# Patient Record
Sex: Female | Born: 1960 | Race: Black or African American | Hispanic: No | Marital: Single
Health system: Midwestern US, Community
[De-identification: ages and names within clinical notes are randomized; demographics above are authoritative.]

## PROBLEM LIST (undated history)

## (undated) DIAGNOSIS — R519 Headache, unspecified: Secondary | ICD-10-CM

## (undated) DIAGNOSIS — M1990 Unspecified osteoarthritis, unspecified site: Secondary | ICD-10-CM

## (undated) DIAGNOSIS — F32A Depression, unspecified: Secondary | ICD-10-CM

## (undated) DIAGNOSIS — Z87442 Personal history of urinary calculi: Secondary | ICD-10-CM

## (undated) DIAGNOSIS — F329 Major depressive disorder, single episode, unspecified: Secondary | ICD-10-CM

## (undated) DIAGNOSIS — F419 Anxiety disorder, unspecified: Secondary | ICD-10-CM

## (undated) DIAGNOSIS — R51 Headache: Secondary | ICD-10-CM

## (undated) DIAGNOSIS — D571 Sickle-cell disease without crisis: Secondary | ICD-10-CM

## (undated) DIAGNOSIS — T7840XA Allergy, unspecified, initial encounter: Secondary | ICD-10-CM

## (undated) DIAGNOSIS — F319 Bipolar disorder, unspecified: Secondary | ICD-10-CM

## (undated) DIAGNOSIS — I10 Essential (primary) hypertension: Secondary | ICD-10-CM

## (undated) DIAGNOSIS — R0600 Dyspnea, unspecified: Secondary | ICD-10-CM

## (undated) DIAGNOSIS — K219 Gastro-esophageal reflux disease without esophagitis: Secondary | ICD-10-CM

## (undated) DIAGNOSIS — R7303 Prediabetes: Secondary | ICD-10-CM

## (undated) DIAGNOSIS — Z1231 Encounter for screening mammogram for malignant neoplasm of breast: Secondary | ICD-10-CM

## (undated) DIAGNOSIS — Z1211 Encounter for screening for malignant neoplasm of colon: Secondary | ICD-10-CM

## (undated) HISTORY — DX: Sickle-cell disease without crisis: D57.1

## (undated) HISTORY — DX: Allergy, unspecified, initial encounter: T78.40XA

## (undated) HISTORY — DX: Anxiety disorder, unspecified: F41.9

## (undated) HISTORY — DX: Unspecified osteoarthritis, unspecified site: M19.90

## (undated) HISTORY — PX: ABDOMINAL HYSTERECTOMY: SHX81

---

## 2008-06-13 NOTE — ED Notes (Signed)
Patient reports pain in left knee x 6 months. Patient reports pain worsened today. She reports pain when walking or putting "pressur on it." ROMx4 extremities. Capillary refill brisk. Pedal pulses present bilaterally.

## 2008-06-13 NOTE — ED Provider Notes (Signed)
Patient is a 48 y.o. female presenting with knee pain. The history is provided by the patient. No language interpreter was used.   Knee Injury   This is a recurrent (Has H/O Ch. Lt. knee pain of 6 months ago) problem. The current episode started more than 1 week ago. The problem occurs constantly. The problem has not changed since onset. The pain is present in the left knee. The quality of the pain is described as dull. The pain is at a severity of 7/10. Associated symptoms include limited range of motion. Pertinent negatives include no numbness, no stiffness, no tingling, no back pain and no neck pain. The symptoms are aggravated by activity and movement. She has tried nothing for the symptoms. There has been no history of extremity trauma.        Past Medical History   Diagnosis Date   ??? Psychiatric disorder      Depression          Past Surgical History   Procedure Date   ??? Hx gyn      5 C-Section   ??? Hx hysterectomy            No family history on file.     History   Social History   ??? Marital Status: Legally Separated     Spouse Name: N/A     Number of Children: N/A   ??? Years of Education: N/A   Occupational History   ??? Not on file.   Social History Main Topics   ??? Tobacco Use: Never   ??? Alcohol Use: No   ??? Drug Use: No   ??? Sexually Active: No   Other Topics Concern   ??? Not on file   Social History Narrative   ??? No narrative on file           ALLERGIES: Review of patient's allergies indicates no known allergies.      Review of Systems   HENT: Negative for neck pain.    Musculoskeletal: Negative for back pain.   Neurological: Negative for tingling and numbness.   All other systems reviewed and are negative.        Filed Vitals:    06/13/2008 10:08 PM 06/13/2008 10:13 PM   BP:  139/98   Pulse: 96    Temp: 98.4 ??F (36.9 ??C)    Resp: 17    Height: 5\' 1"  (1.549 m)    Weight: 190 lb (86.183 kg)    SpO2: 95%               Physical Exam    Constitutional: She is oriented. She appears well-developed and well-nourished. No distress.   HENT:   Head: Normocephalic and atraumatic.   Right Ear: External ear normal.   Left Ear: External ear normal.   Mouth/Throat: Oropharynx is clear and moist. No oropharyngeal exudate.   Eyes: Conjunctivae and extraocular motions are normal. Pupils are equal, round, and reactive to light. Right eye exhibits no discharge. Left eye exhibits no discharge. No scleral icterus.   Neck: Normal range of motion. No tracheal deviation present. No thyromegaly present.   Cardiovascular: Normal rate, regular rhythm and normal heart sounds.    No murmur heard.  Pulmonary/Chest: Effort normal and breath sounds normal. No respiratory distress. She has no wheezes. She has no rales. She exhibits no tenderness.   Abdominal: Soft. She exhibits no distension. No tenderness. She has no rebound and no guarding.   Musculoskeletal: Normal range of  motion. She exhibits tenderness. She exhibits no edema.        Tender Lt knee with limitation of ROM. No swelling or deformity. No lower leg or calf muscle tenderness, Homan's sign is negative   Lymphadenopathy:     She has no cervical adenopathy.   Neurological: She is alert and oriented. No cranial nerve deficit. Coordination normal.   Skin: Skin is warm and dry. No rash noted. She is not diaphoretic. No erythema. No pallor.   Psychiatric: She has a normal mood and affect. Her behavior is normal. Judgment and thought content normal.            Coding      Procedures

## 2008-06-14 MED ORDER — HYDROCODONE-ACETAMINOPHEN 5 MG-500 MG TAB
5-500 mg | ORAL_TABLET | ORAL | Status: AC | PRN
Start: 2008-06-14 — End: 2008-06-21

## 2008-06-14 MED ADMIN — ketorolac (TORADOL) injection 60 mg: INTRAMUSCULAR | @ 04:00:00 | NDC 00409379501

## 2008-06-14 MED FILL — KETOROLAC TROMETHAMINE 30 MG/ML INJECTION: 30 mg/mL (1 mL) | INTRAMUSCULAR | Qty: 1

## 2008-06-14 MED FILL — KETOROLAC TROMETHAMINE 30 MG/ML INJECTION: 30 mg/mL (1 mL) | INTRAMUSCULAR | Qty: 2

## 2008-11-01 MED ORDER — MECLIZINE 25 MG TAB
25 mg | ORAL_TABLET | Freq: Three times a day (TID) | ORAL | Status: AC | PRN
Start: 2008-11-01 — End: 2009-01-30

## 2008-11-01 MED ORDER — METHYLPREDNISOLONE 4 MG TABS IN A DOSE PACK
4 mg | PACK | ORAL | Status: DC
Start: 2008-11-01 — End: 2009-08-22

## 2008-11-01 MED ADMIN — meclizine (ANTIVERT) tablet 25 mg: ORAL | @ 12:00:00 | NDC 00536398501

## 2008-11-01 MED FILL — MECLIZINE 12.5 MG TAB: 12.5 mg | ORAL | Qty: 2

## 2008-11-01 NOTE — ED Notes (Signed)
Patient stable at discharge.  Patient given 2 script (s) and discharge instructions.  Patient verbalized understanding and instructions and script (s).  Patient ambulatory out of ED with son. Gait steady.

## 2008-11-01 NOTE — ED Notes (Signed)
Pt states, "I woke up and the whole room was going super spin mode"; dizziness began this morning; denies any SOB or chest pain, denies any nausea or vomiting; reports having one loose bowel movement this morning; denies any abdominal pain

## 2008-11-01 NOTE — ED Provider Notes (Signed)
Patient is a 48 y.o. female presenting with dizziness. The history is provided by the patient (When she woke up this morning the room was spinning, then she sat up and it stopped.  She layed down again and the room started spinning again, then sat up and it stopped.  Denies fever, chills, recent colds, or flu.). No language interpreter was used.   Dizziness   This is a new problem. The current episode started 1 to 2 hours ago. The problem occurs constantly. The problem has not changed since onset. There was no loss of consciousness. Associated With: lying down. Associated symptoms include dizziness. Pertinent negatives include no visual change, no chest pain, no palpitations, no clumsiness, no confusion, no fever, no malaise/fatigue, no abdominal pain, no bowel incontinence, no bladder incontinence, no congestion, no headaches, no back pain, no focal weakness, no light-headedness, no seizures, no slurred speech, no melena, no anal bleeding and no Head Injury. She has tried nothing for the symptoms. Her past medical history does not include no CVA, no TIA, no CAD, no DM, no HTN, no vertigo, no seizures, no syncope or no AICD.        Past Medical History   Diagnosis Date   ??? Psychiatric disorder      Depression, bipolar          Past Surgical History   Procedure Date   ??? Hx gyn      5 C-Section   ??? Hx hysterectomy    ??? Hx cholecystectomy            No family history on file.     History   Social History   ??? Marital Status: Legally Separated     Spouse Name: N/A     Number of Children: N/A   ??? Years of Education: N/A   Occupational History   ??? Not on file.   Social History Main Topics   ??? Tobacco Use: Never   ??? Alcohol Use: No   ??? Drug Use: No   ??? Sexually Active: No   Other Topics Concern   ??? Not on file   Social History Narrative   ??? No narrative on file           ALLERGIES: Review of patient's allergies indicates no known allergies.      Review of Systems   Constitutional: Negative for fever and malaise/fatigue.    HENT: Negative for congestion.    Cardiovascular: Negative for chest pain and palpitations.   Gastrointestinal: Negative for abdominal pain, melena and anal bleeding.   Genitourinary: Negative for bladder incontinence.   Musculoskeletal: Negative for back pain.   Neurological: Positive for dizziness. Negative for focal weakness, seizures, syncope, light-headedness and headaches.   Psychiatric/Behavioral: Negative for confusion.   All other systems reviewed and are negative.        Filed Vitals:    11/01/2008  7:30 AM   BP: 143/87   Pulse: 84   Temp: 98.2 ??F (36.8 ??C)   Resp: 22   Height: 5\' 1"  (1.549 m)   Weight: 192 lb (87.091 kg)   SpO2: 100%              Physical Exam   Nursing note and vitals reviewed.  Constitutional: She is oriented to person, place, and time. She appears well-developed and well-nourished. No distress.   HENT:   Head: Normocephalic and atraumatic.   Right Ear: External ear normal.   Left Ear: External ear normal.  Mouth/Throat: Oropharynx is clear and moist. No oropharyngeal exudate.        Nose;  Marked edema of nostrils and posterior turbinates without erythema or exudate.   Eyes: Conjunctivae and extraocular motions are normal. Pupils are equal, round, and reactive to light. Right eye exhibits no discharge. Left eye exhibits no discharge. No scleral icterus.   Neck: Normal range of motion. No JVD present. No tracheal deviation present. No thyromegaly present.   Cardiovascular: Normal rate, regular rhythm and normal heart sounds.  Exam reveals no gallop and no friction rub.    No murmur heard.  Pulmonary/Chest: Effort normal and breath sounds normal. No respiratory distress. She has no wheezes. She has no rales. She exhibits no tenderness.   Abdominal: Soft. Bowel sounds are normal. She exhibits no distension and no mass. No tenderness. She has no rebound and no guarding.   Musculoskeletal: Normal range of motion. She exhibits no edema and no tenderness.   Lymphadenopathy:      She has no cervical adenopathy.   Neurological: She is alert and oriented to person, place, and time. She has normal reflexes. She displays normal reflexes. No cranial nerve deficit. She exhibits normal muscle tone. Coordination normal.   Skin: Skin is warm. No rash noted. She is not diaphoretic. No erythema. No pallor.   Psychiatric: She has a normal mood and affect. Her behavior is normal. Judgment and thought content normal.        Coding    Procedures

## 2008-11-01 NOTE — ED Notes (Signed)
Pt reports feeling "woozy" when sitting, dizziness worsens when laying down

## 2009-04-09 ENCOUNTER — Inpatient Hospital Stay: Admit: 2009-04-09 | Discharge: 2009-04-09 | Disposition: A | Discharge status: Home / Self Care | Payer: Self-pay

## 2009-04-09 DIAGNOSIS — Z5321 Procedure and treatment not carried out due to patient leaving prior to being seen by health care provider: Secondary | ICD-10-CM

## 2009-04-10 NOTE — ED Provider Notes (Signed)
Patient is a 49 y.o. female presenting with knee pain. History provided by: LWBS.   Knee Pain          Past Medical History   Diagnosis Date   ??? Psychiatric disorder      Depression, bipolar          Past Surgical History   Procedure Date   ??? Hx gyn      5 C-Section   ??? Hx hysterectomy    ??? Hx cholecystectomy            No family history on file.     History   Social History   ??? Marital Status: Legally Separated     Spouse Name: N/A     Number of Children: N/A   ??? Years of Education: N/A   Occupational History   ??? Not on file.   Social History Main Topics   ??? Smoking status: Never Smoker    ??? Smokeless tobacco: Never Used   ??? Alcohol Use: No   ??? Drug Use: No   ??? Sexually Active: No   Other Topics Concern   ??? Not on file   Social History Narrative   ??? No narrative on file           ALLERGIES: Review of patient's allergies indicates no known allergies.      Review of Systems    Filed Vitals:    04/09/09 1326   BP: 166/78   Pulse: 89   Temp: 98 ??F (36.7 ??C)   Resp: 16   Height: 5\' 1"  (1.549 m)   Weight: 155 lb (70.308 kg)   SpO2: 100%              Physical Exam   Constitutional:        LWBS        Coding    Procedures

## 2009-08-22 ENCOUNTER — Inpatient Hospital Stay: Admit: 2009-08-22 | Discharge: 2009-08-22 | Disposition: A | Discharge status: Home / Self Care | Payer: MEDICAID

## 2009-08-22 DIAGNOSIS — S838X9A Sprain of other specified parts of unspecified knee, initial encounter: Secondary | ICD-10-CM

## 2009-08-22 LAB — HCG URINE, QL. - POC: Pregnancy test,urine (POC): NEGATIVE

## 2009-08-22 MED ORDER — KETOPROFEN 75 MG CAP
75 mg | ORAL_CAPSULE | Freq: Three times a day (TID) | ORAL | Status: AC
Start: 2009-08-22 — End: 2009-09-01

## 2009-08-22 MED ORDER — HYDROCODONE-ACETAMINOPHEN 7.5 MG-500 MG TAB
ORAL_TABLET | Freq: Four times a day (QID) | ORAL | Status: DC | PRN
Start: 2009-08-22 — End: 2009-10-03

## 2009-08-22 NOTE — ED Provider Notes (Signed)
Patient is a 49 y.o. female presenting with knee pain. The history is provided by the patient (On feet a lot yesterday and woke up this AM with left knee pain and swelling.  Has Hx. of problems with left knee.  Denies fall, injury, trauma.). No language interpreter was used.   Knee Injury   This is a new problem. The current episode started 3 to 5 hours ago. The problem occurs constantly. The problem has not changed since onset. The pain is present in the left knee. The quality of the pain is described as aching. The pain is at a severity of 7/10. The pain is moderate. Associated symptoms include limited range of motion. Pertinent negatives include no numbness, no stiffness, no tingling, no itching, no back pain and no neck pain. The symptoms are aggravated by standing, activity and movement. She has tried nothing for the symptoms. There has been no history of extremity trauma.        Past Medical History   Diagnosis Date   ??? Psychiatric disorder      Depression, bipolar          Past Surgical History   Procedure Date   ??? Hx gyn      5 C-Section   ??? Hx hysterectomy    ??? Hx cholecystectomy            No family history on file.     History   Social History   ??? Marital Status: Legally Separated     Spouse Name: N/A     Number of Children: N/A   ??? Years of Education: N/A   Occupational History   ??? Not on file.   Social History Main Topics   ??? Smoking status: Never Smoker    ??? Smokeless tobacco: Not on file   ??? Alcohol Use: No   ??? Drug Use: No   ??? Sexually Active: No   Other Topics Concern   ??? Not on file   Social History Narrative   ??? No narrative on file                    ALLERGIES: Review of patient's allergies indicates no known allergies.      Review of Systems   HENT: Negative for neck pain.    Musculoskeletal: Negative for back pain and stiffness.   Skin: Negative for itching.   Neurological: Negative for tingling and numbness.   All other systems reviewed and are negative.        Filed Vitals:    08/22/09 1025    BP: 116/79   Pulse: 97   Temp: 97.9 ??F (36.6 ??C)   Resp: 16   Height: 5\' 1"  (1.549 m)   Weight: 201 lb (91.173 kg)   SpO2: 96%              Physical Exam   Nursing note and vitals reviewed.  Constitutional: She is oriented to person, place, and time. She appears well-developed and well-nourished. No distress.   HENT:   Head: Normocephalic and atraumatic.   Right Ear: External ear normal.   Left Ear: External ear normal.   Nose: Nose normal.   Mouth/Throat: Oropharynx is clear and moist. No oropharyngeal exudate.   Eyes: Conjunctivae and EOM are normal. Pupils are equal, round, and reactive to light. Right eye exhibits no discharge. Left eye exhibits no discharge. No scleral icterus.   Neck: Normal range of motion. Neck supple. No JVD present. No  tracheal deviation present. No thyromegaly present.   Cardiovascular: Normal rate, regular rhythm and normal heart sounds.    No murmur heard.  Pulmonary/Chest: Effort normal and breath sounds normal. No stridor. No respiratory distress. She has no wheezes. She has no rales. She exhibits no tenderness.   Abdominal: Soft. Bowel sounds are normal. She exhibits no distension and no mass. No tenderness. She has no rebound and no guarding.   Musculoskeletal: Normal range of motion. She exhibits tenderness. She exhibits no edema.        Left knee;  Tenderness to ROM without crepitus.  Neg. Edema.  Pos. Mild patellar compression test.  Pos. Medial joint line tenderness   Neg. Lateral joint line tenderness.  Neg. Drawer's sign.  Neg. Instability.   Lymphadenopathy:     She has no cervical adenopathy.   Neurological: She is alert and oriented to person, place, and time. She has normal reflexes. No cranial nerve deficit. Coordination normal.   Skin: Skin is warm. No rash noted. She is not diaphoretic. No erythema. No pallor.   Psychiatric: She has a normal mood and affect. Her behavior is normal. Judgment and thought content normal.        MDM    Procedures

## 2009-10-03 MED ORDER — HYDROCODONE-ACETAMINOPHEN 5 MG-500 MG TAB
5-500 mg | ORAL_TABLET | Freq: Four times a day (QID) | ORAL | Status: AC | PRN
Start: 2009-10-03 — End: ?

## 2009-10-03 MED ORDER — CYCLOBENZAPRINE 10 MG TAB
10 mg | ORAL_TABLET | Freq: Three times a day (TID) | ORAL | Status: AC | PRN
Start: 2009-10-03 — End: ?

## 2009-10-03 MED ADMIN — cyclobenzaprine (FLEXERIL) tablet 10 mg: ORAL | @ 17:00:00 | NDC 59746017706

## 2009-10-03 MED ADMIN — hydrocodone-acetaminophen (LORTAB) 5-500 mg per tablet 1 Tab: ORAL | @ 17:00:00 | NDC 00406035762

## 2009-10-03 NOTE — ED Notes (Signed)
Patient requesting heat for her back. Given a warm pack for her back.

## 2009-10-03 NOTE — ED Notes (Signed)
Pt  given copy of dc instructions and 2 script(s).  Pt  verbalized understanding of instructions and script (s).  Patient alert and oriented and in no acute distress.  Patient discharged home ambulatory with multiple family members.

## 2009-10-03 NOTE — ED Notes (Signed)
Pain in neck, lower back and left knee.

## 2009-10-12 NOTE — ED Provider Notes (Signed)
HPI Comments: 49 y/o bf presents with low back pain after falling in [employer] yesterday.    Patient is a 49 y.o. female presenting with back pain. The history is provided by the patient. No language interpreter was used.   Back Pain   This is a new problem. The current episode started yesterday. The problem has been gradually worsening. The problem occurs constantly. Patient reports no work related injury.The pain is associated with a fall. The pain is present in the lumbar spine. The quality of the pain is described as cramping. The pain does not radiate. The pain is at a severity of 10/10. The pain is moderate. The pain is the same all the time. She has tried nothing for the symptoms. Risk factors include obesity.        Past Medical History   Diagnosis Date   ??? Psychiatric disorder      Depression, bipolar   ??? Hypertension           Past Surgical History   Procedure Date   ??? Hx gyn      5 C-Section   ??? Hx hysterectomy    ??? Hx cholecystectomy            No family history on file.     History   Social History   ??? Marital Status: Legally Separated     Spouse Name: N/A     Number of Children: N/A   ??? Years of Education: N/A   Occupational History   ??? Not on file.   Social History Main Topics   ??? Smoking status: Never Smoker    ??? Smokeless tobacco: Not on file   ??? Alcohol Use: No   ??? Drug Use: No   ??? Sexually Active: No   Other Topics Concern   ??? Not on file   Social History Narrative   ??? No narrative on file                    ALLERGIES: Tetanus      Review of Systems   Musculoskeletal: Positive for back pain.   All other systems reviewed and are negative.        Filed Vitals:    10/03/09 0913   BP: 144/78   Pulse: 80   Temp: 98 ??F (36.7 ??C)   Resp: 18   Height: 5\' 3"  (1.6 m)   Weight: 180 lb (81.647 kg)   SpO2: 98%              Physical Exam   Nursing note and vitals reviewed.  Constitutional: She is oriented to person, place, and time. She appears well-developed and well-nourished.   HENT:    Head: Normocephalic and atraumatic.   Right Ear: External ear normal.   Left Ear: External ear normal.   Mouth/Throat: Oropharynx is clear and moist. No oropharyngeal exudate.   Eyes: Conjunctivae and EOM are normal. Pupils are equal, round, and reactive to light. Right eye exhibits no discharge. Left eye exhibits no discharge. No scleral icterus.   Neck: Normal range of motion. No tracheal deviation present. No thyromegaly present.   Cardiovascular: Normal rate, regular rhythm and normal heart sounds.    No murmur heard.  Pulmonary/Chest: Effort normal and breath sounds normal. No respiratory distress. She has no wheezes. She has no rales. She exhibits no tenderness.   Abdominal: Soft. She exhibits no distension. No tenderness. She has no rebound and no guarding.   Musculoskeletal: Normal  range of motion. She exhibits tenderness. She exhibits no edema.        Lumbar muscles tenderness and palable spasms   Lymphadenopathy:     She has no cervical adenopathy.   Neurological: She is alert and oriented to person, place, and time. No cranial nerve deficit. Coordination normal.   Skin: Skin is warm. No erythema.   Psychiatric: She has a normal mood and affect. Her behavior is normal. Judgment and thought content normal.        MDM    Procedures

## 2010-09-24 ENCOUNTER — Emergency Department: Payer: Self-pay | Admitting: Emergency Medicine

## 2010-12-16 ENCOUNTER — Emergency Department: Payer: Self-pay | Admitting: Emergency Medicine

## 2011-04-13 ENCOUNTER — Emergency Department: Payer: Self-pay | Admitting: Emergency Medicine

## 2011-04-13 LAB — BASIC METABOLIC PANEL
Anion Gap: 8 (ref 7–16)
BUN: 7 mg/dL (ref 7–18)
Chloride: 106 mmol/L (ref 98–107)
Co2: 28 mmol/L (ref 21–32)
Creatinine: 0.87 mg/dL (ref 0.60–1.30)
EGFR (African American): >60
EGFR (Non-African Amer.): >60
Osmolality: 281 (ref 275–301)
Potassium: 3.3 mmol/L — ABNORMAL LOW (ref 3.5–5.1)

## 2011-04-13 LAB — CBC
HCT: 35.8 % (ref 35.0–47.0)
HGB: 12 g/dL (ref 12.0–16.0)
MCH: 25.2 pg — ABNORMAL LOW (ref 26.0–34.0)
MCHC: 33.4 g/dL (ref 32.0–36.0)
MCV: 75 fL — ABNORMAL LOW (ref 80–100)

## 2011-04-13 LAB — URINALYSIS, COMPLETE
Bilirubin,UR: NEGATIVE
Ketone: NEGATIVE
Ph: 5 (ref 4.5–8.0)
Protein: 100

## 2012-01-27 ENCOUNTER — Encounter

## 2012-02-24 ENCOUNTER — Encounter

## 2013-01-24 ENCOUNTER — Emergency Department: Payer: Self-pay | Admitting: Emergency Medicine

## 2013-04-07 ENCOUNTER — Emergency Department: Payer: Self-pay | Admitting: Emergency Medicine

## 2013-05-14 ENCOUNTER — Encounter

## 2013-06-19 NOTE — Anesthesia Pre-Procedure Evaluation (Addendum)
Anesthetic History   No history of anesthetic complications           Review of Systems / Medical History  Patient summary reviewed, nursing notes reviewed and pertinent labs reviewed    Pulmonary  Within defined limits               Neuro/Psych   Within defined limits           Cardiovascular    Hypertension: well controlled            Exercise tolerance: >4 METS     GI/Hepatic/Renal               Comments: Screening   Endo/Other  Within defined limits           Other Findings            Physical Exam    Airway  Mallampati: II  TM Distance: 4 - 6 cm         Cardiovascular  Regular rate and rhythm,  S1 and S2 normal,  no murmur, click, rub, or gallop  Rhythm: regular  Rate: normal         Dental  No notable dental hx       Pulmonary  Breath sounds clear to auscultation               Abdominal  Abdominal exam normal       Other Findings            Anesthetic Plan    ASA: 2  Anesthesia type: general          Induction: Intravenous  Anesthetic plan and risks discussed with: Patient

## 2013-06-20 ENCOUNTER — Inpatient Hospital Stay: Payer: MEDICARE

## 2013-06-20 MED ORDER — SODIUM CHLORIDE 0.9 % IJ SYRG
Freq: Three times a day (TID) | INTRAMUSCULAR | Status: DC
Start: 2013-06-20 — End: 2013-06-20

## 2013-06-20 MED ORDER — SIMETHICONE 40 MG/0.6 ML ORAL DROPS, SUSP
40 mg/0.6 mL | ORAL | Status: DC | PRN
Start: 2013-06-20 — End: 2013-06-20

## 2013-06-20 MED ORDER — SODIUM CHLORIDE 0.9 % IJ SYRG
INTRAMUSCULAR | Status: DC | PRN
Start: 2013-06-20 — End: 2013-06-20

## 2013-06-20 MED ADMIN — propofol (DIPRIVAN) 10 mg/mL injection: INTRAVENOUS | @ 14:00:00 | NDC 00703285604

## 2013-06-20 MED ADMIN — 0.9% sodium chloride infusion: INTRAVENOUS | @ 14:00:00 | NDC 00409798309

## 2013-06-20 MED FILL — SODIUM CHLORIDE 0.9 % IV: INTRAVENOUS | Qty: 1000

## 2013-06-20 NOTE — H&P (Signed)
Gastroenterology Outpatient History and Physical    Patient: Katelyn [name]    Physician: Blake DivineMichael S Zalika Tieszen, MD    Vital Signs: Blood pressure 145/77, pulse 89, resp. rate 16, height 5\' 1"  (1.549 m), weight 94.802 kg (209 lb), SpO2 96 %.    Allergies:   Allergies   Allergen Reactions   ??? Tetanus Vaccines & Toxoid Nausea Only     Patient stated she is NOT allergic to tetanus       Chief Complaint: Screening colonoscopy    History of Present Illness: 53 yo woman referred for CRC screening.    Justification for Procedure: above    History:  Past Medical History   Diagnosis Date   ??? Psychiatric disorder      Depression, bipolar   ??? Hypertension       Past Surgical History   Procedure Laterality Date   ??? Hx gyn       5 C-Section   ??? Hx hysterectomy     ??? Hx cholecystectomy        History     Social History   ??? Marital Status: LEGALLY SEPARATED     Spouse Name: N/A     Number of Children: N/A   ??? Years of Education: N/A     Social History Main Topics   ??? Smoking status: Never Smoker    ??? Smokeless tobacco: Not on file   ??? Alcohol Use: No   ??? Drug Use: No   ??? Sexual Activity: No     Other Topics Concern   ??? Not on file     Social History Narrative      Family History   Problem Relation Age of Onset   ??? Cancer Maternal Aunt      breast       Medications:   Prior to Admission medications    Medication Sig Start Date End Date Taking? Authorizing Provider   alprazolam Prudy Feeler(XANAX) 0.25 mg tablet Take  by mouth nightly as needed.   Yes Phys Other, MD   hydrochlorothiazide (MICROZIDE) 12.5 mg capsule Take 12.5 mg by mouth daily.   Yes Phys Other, MD   cyclobenzaprine (FLEXERIL) 10 mg tablet Take 1 Tab by mouth three (3) times daily as needed for Muscle Spasm(s). 10/03/09  Yes Arneta ClicheMartina P Callum, MD   hydrocodone-acetaminophen (VICODIN) 5-500 mg per tablet Take 1 Tab by mouth every six (6) hours as needed for Pain. 10/03/09  Yes Arneta ClicheMartina P Callum, MD   paroxetine (PAXIL) 10 mg tablet Take 10 mg by mouth daily. Indications: DEPRESSION    Yes Phys Other, MD       Physical Exam:   General: alert, no distress   HEENT: Head: Normocephalic, no lesions, without obvious abnormality.   Heart: regular rate and rhythm, S1, S2 normal, no murmur, click, rub or gallop   Lungs: chest clear, no wheezing, rales, normal symmetric air entry   Abdominal: soft, nontender, nondistended, + BS   Neurological: Grossly normal   Extremities: extremities normal, atraumatic, no cyanosis or edema     Findings/Diagnosis: CRC screening    Plan of Care/Planned Procedure: Colonoscopy with MAC    Signed By: Blake DivineMichael S Nemiah Kissner, MD     Jun 20, 2013

## 2013-06-20 NOTE — Procedures (Signed)
Procedures  by Blake DivineManetas, Breeanne Oblinger S, MD at 06/20/13 1011                Author: Blake DivineManetas, Safi Culotta S, MD  Service: Gastroenterology  Author Type: Physician       Filed: 06/20/13 1012  Date of Service: 06/20/13 1011  Status: Signed          Editor: Blake DivineManetas, Valeree Leidy S, MD (Physician)            Pre-procedure Diagnoses        1. Special screening for malignant neoplasms, colon [V76.51]                           Post-procedure Diagnoses        1. Diverticulosis of large intestine without hemorrhage [562.10]                           Procedures        1. COLONOSCOPY,DIAGNOSTIC [[id number]]                                           Colonoscopy Procedure Note      Indications:   Screening Colonoscopy - V76.51      Referring Physician: Johnny BridgeSheila Turbides Minaya, MD   Anesthesia/Sedation: MAC anesthesia Propofol   Endoscopist:  Dr. Daralene MilchMichael Trenell Concannon      Procedure in Detail:   Informed consent was obtained for the procedure, including sedation.  Risks of perforation, hemorrhage, adverse drug reaction, and aspiration were discussed. The patient was placed in the left lateral decubitus position.  Based on the pre-procedure assessment,  including review of the patient's medical history, medications, allergies, and review of systems, she had been deemed to be an appropriate candidate  for moderate sedation; she was therefore sedated with the medications listed above.   The patient was monitored continuously with ECG tracing,  pulse oximetry, blood pressure monitoring, and direct observations.        A rectal examination was performed. The [id number] was inserted into the rectum and advanced under direct vision to the cecum, which was identified by the ileocecal valve and appendiceal orifice.  The quality of the colonic preparation was adequate.  A  careful inspection was made as the colonoscope was withdrawn, including a retroflexed view of the rectum; findings and interventions are described below.  Appropriate photodocumentation was  obtained.      Findings:       1.  Scope was advanced to the cecum.   2.  Preparation was adequate.   3.  No polyps seen.   4.  Scattered sigmoid diverticulosis.      Therapies:   none      Specimen:  none       Complications: None were encountered during the procedure .       EBL: none.      Recommendations:    -Repeat Colonoscopy in 10 years.      Signed By: Blake DivineMichael S Kamarie Palma, MD                         Jun 20, 2013

## 2013-06-20 NOTE — Procedures (Signed)
Colonoscopy Procedure Note    Indications:   Screening Colonoscopy - V76.51    Referring Physician: Johnny BridgeSheila Turbides Minaya, MD  Anesthesia/Sedation: MAC anesthesia Propofol  Endoscopist:  Dr. Daralene MilchMichael Tasheena Wambolt    Procedure in Detail:  Informed consent was obtained for the procedure, including sedation.  Risks of perforation, hemorrhage, adverse drug reaction, and aspiration were discussed. The patient was placed in the left lateral decubitus position.  Based on the pre-procedure assessment, including review of the patient's medical history, medications, allergies, and review of systems, she had been deemed to be an appropriate candidate for moderate sedation; she was therefore sedated with the medications listed above.   The patient was monitored continuously with ECG tracing, pulse oximetry, blood pressure monitoring, and direct observations.      A rectal examination was performed. The CFQ180AL was inserted into the rectum and advanced under direct vision to the cecum, which was identified by the ileocecal valve and appendiceal orifice.  The quality of the colonic preparation was adequate.  A careful inspection was made as the colonoscope was withdrawn, including a retroflexed view of the rectum; findings and interventions are described below.  Appropriate photodocumentation was obtained.    Findings:     1.  Scope was advanced to the cecum.  2.  Preparation was adequate.  3.  No polyps seen.  4.  Scattered sigmoid diverticulosis.    Therapies:  none    Specimen:  none     Complications: None were encountered during the procedure.     EBL: none.    Recommendations:   -Repeat Colonoscopy in 10 years.    Signed By: Blake DivineMichael S Kaelei Wheeler, MD                        Jun 20, 2013

## 2013-06-20 NOTE — Other (Signed)
.  Saddie BendersStacey L Bloxham  [birth year]  [id number]    Situation:  Verbal report received from: L.Elliott RN  Procedure: Procedure(s):  COLONOSCOPY    Background:    Preoperative diagnosis: SCREENING  Postoperative diagnosis: * No post-op diagnosis entered *    Operator:  Dr.  Teressa LowerManetas  Assistant(s): Endoscopy Technician-1: Emiliano Dyerhris Crocker  Endoscopy RN-1: Woodroe ChenLeane D Elliott    Specimens: * No specimens in log *  H. Pylori No  Assessment:  Intra-procedure medications        Anesthesia gave intra-procedure sedation and medications, see anesthesia flow sheet      Intravenous fluids: NS@ KVO     Vital signs stable      Abdominal assessment: round and soft     Recommendation:  Discharge patient per MD order .  R   Family or Friend , pt's daughter in law is present  Permission to share finding with family or friend No

## 2013-06-20 NOTE — Anesthesia Post-Procedure Evaluation (Signed)
Post-Anesthesia Evaluation and Assessment    Patient: Katelyn [name] MRN: [id number]  SSN: [id number]    Date of Birth: 1960-03-01  Age: 53 y.o.  Sex: female       Cardiovascular Function/Vital Signs  Visit Vitals   Item Reading   ??? BP 117/65   ??? Pulse 80   ??? Resp 13   ??? Ht 5\' 1"  (1.549 m)   ??? Wt 94.802 kg (209 lb)   ??? BMI 39.51 kg/m2   ??? SpO2 93%       Patient is status post general anesthesia for Procedure(s):  COLONOSCOPY.    Nausea/Vomiting: None    Postoperative hydration reviewed and adequate.    Pain:  Pain Scale 1: Numeric (0 - 10) (06/20/13 1036)  Pain Intensity 1: 0 (06/20/13 1036)   Managed    Neurological Status:       At baseline    Mental Status and Level of Consciousness: Alert and oriented     Pulmonary Status:   O2 Device: Room air (06/20/13 1036)   Adequate oxygenation and airway patent    Complications related to anesthesia: None    Post-anesthesia assessment completed. No concerns    Signed By: Verdon CumminsMoon Caelin Rosen, MD     Jun 20, 2013

## 2013-06-21 MED FILL — DIPRIVAN 10 MG/ML INTRAVENOUS EMULSION: 10 mg/mL | INTRAVENOUS | Qty: 200

## 2013-08-07 LAB — D DIMER: D-dimer: 1.35 mg/L FEU — ABNORMAL HIGH (ref 0.00–0.65)

## 2013-08-07 LAB — D-DIMER, QUANTITATIVE: D-Dimer, Quant: 1.35 mg/L FEU — ABNORMAL HIGH (ref 0.00–0.65)

## 2013-08-07 MED ORDER — SODIUM CHLORIDE 0.9 % IJ SYRG
INTRAMUSCULAR | Status: DC | PRN
Start: 2013-08-07 — End: 2013-08-07
  Administered 2013-08-07: 22:00:00 via INTRAVENOUS

## 2013-08-07 MED ORDER — BUTALBITAL-ACETAMINOPHEN-CAFFEINE 50 MG-325 MG-40 MG TAB
50-325-40 mg | ORAL | Status: AC
Start: 2013-08-07 — End: 2013-08-07
  Administered 2013-08-07: 22:00:00 via ORAL

## 2013-08-07 MED ORDER — KETOROLAC TROMETHAMINE 30 MG/ML INJECTION
30 mg/mL (1 mL) | INTRAMUSCULAR | Status: AC
Start: 2013-08-07 — End: 2013-08-07
  Administered 2013-08-07: 22:00:00 via INTRAVENOUS

## 2013-08-07 MED ADMIN — sodium chloride 0.9 % bolus infusion 1,000 mL: INTRAVENOUS | @ 22:00:00 | NDC 00409798309

## 2013-08-07 MED FILL — KETOROLAC TROMETHAMINE 30 MG/ML INJECTION: 30 mg/mL (1 mL) | INTRAMUSCULAR | Qty: 1

## 2013-08-07 MED FILL — BUTALBITAL-ACETAMINOPHEN-CAFFEINE 50 MG-325 MG-40 MG TAB: 50-325-40 mg | ORAL | Qty: 1

## 2013-08-07 MED FILL — SODIUM CHLORIDE 0.9 % IV: INTRAVENOUS | Qty: 1000

## 2013-08-07 NOTE — Progress Notes (Signed)
Vascular tech, Estrella Deedsaniel Spivey contacted for DUPLEX LOWER EXT VENOUS LEFT study.  ETA: ~1 hr.

## 2013-08-07 NOTE — ED Provider Notes (Signed)
Patient is a 53 y.o. female presenting with general illness, knee pain, and migraines. The history is provided by the patient.   Generalized Body Aches  This is a new problem. The current episode started yesterday (pt states she has been taking care of who sister who passed away and the funeral was Sat.  pt states her body may just be tired from all of that.). The problem occurs constantly. The problem has not changed since onset.Associated symptoms include headaches. Pertinent negatives include no chest pain, no abdominal pain and no shortness of breath. Nothing aggravates the symptoms. Nothing relieves the symptoms. She has tried nothing for the symptoms.   Knee Pain   This is a chronic problem. The current episode started yesterday (pt reports hx of baker's cyst and c/o pain behind L. knee). The problem occurs constantly. The problem has not changed since onset.The pain is present in the left knee. The quality of the pain is described as intermittent. The pain is at a severity of 7/10. The pain is moderate. Pertinent negatives include no numbness and no tingling. The symptoms are aggravated by activity, movement and palpation. Treatments tried: pt has norco states she last took yesterday but states it is not helping with pain. There has been no history of extremity trauma.   Migraine   This is a new problem. The current episode started yesterday (pt states HA started as intermittent.  pt states she hasn't had a migraine in years so she doesn't have any meds to take). The problem occurs constantly. The problem has been gradually worsening. The headache is aggravated by an unknown factor. The pain is located in the bilateral and temporal region. The pain is at a severity of 7/10. Pertinent negatives include no fever, no shortness of breath, no weakness, no tingling, no dizziness, no visual change, no nausea and no vomiting. She has tried nothing for the symptoms.        Past Medical History   Diagnosis Date    ??? Psychiatric disorder      Depression, bipolar   ??? Hypertension         Past Surgical History   Procedure Laterality Date   ??? Hx gyn       5 C-Section   ??? Hx hysterectomy     ??? Hx cholecystectomy           Family History   Problem Relation Age of Onset   ??? Cancer Maternal Aunt      breast        History     Social History   ??? Marital Status: LEGALLY SEPARATED     Spouse Name: N/A     Number of Children: N/A   ??? Years of Education: N/A     Occupational History   ??? Not on file.     Social History Main Topics   ??? Smoking status: Never Smoker    ??? Smokeless tobacco: Not on file   ??? Alcohol Use: No   ??? Drug Use: No   ??? Sexual Activity: No     Other Topics Concern   ??? Not on file     Social History Narrative                  ALLERGIES: Tetanus vaccines & toxoid      Review of Systems   Constitutional: Negative for fever.   Respiratory: Negative for shortness of breath.    Cardiovascular: Negative for chest pain.  Gastrointestinal: Negative for nausea, vomiting and abdominal pain.   Musculoskeletal: Positive for myalgias and arthralgias.   Neurological: Positive for headaches. Negative for dizziness, tingling, weakness and numbness.   All other systems reviewed and are negative.      Filed Vitals:    08/07/13 1736   BP: 115/84   Pulse: 120   Temp: 99.7 ??F (37.6 ??C)   Resp: 18   Height: 5\' 1"  (1.549 m)   Weight: 94.348 kg (208 lb)   SpO2: 95%            Physical Exam   Constitutional: She is oriented to person, place, and time. She appears well-developed and well-nourished. No distress.   HENT:   Head: Normocephalic and atraumatic.   Eyes: Conjunctivae are normal.   Cardiovascular: Normal rate, regular rhythm and normal heart sounds.    Pulmonary/Chest: Effort normal and breath sounds normal. No respiratory distress. She has no wheezes. She has no rales.   Musculoskeletal: Normal range of motion.        Left knee: She exhibits no swelling, no effusion, no ecchymosis and  no deformity. Tenderness (to the posterior knee, no significant swelling noted, no erythema) found.   Neurological: She is alert and oriented to person, place, and time.   Skin: Skin is warm and dry.   Psychiatric: She has a normal mood and affect. Her behavior is normal. Judgment and thought content normal.        MDM  Number of Diagnoses or Management Options  Diagnosis management comments: DDX: DVT, migraine, URI, PNA, viral illness    Progress Note:  Discussed imaging results with pt.  She is excited about news.  Pt states she feels much better now than when she came in.       Amount and/or Complexity of Data Reviewed  Clinical lab tests: ordered and reviewed  Tests in the radiology section of CPT??: ordered and reviewed  Discuss the patient with other providers: yes        Procedures

## 2013-08-07 NOTE — ED Notes (Signed)
Discharge instructions were given to the patient by SYLVIA S PEGRAM, RN.     The patient left the Emergency Department ambulatory, alert and oriented and in no acute distress with 1 prescriptions. The patient was encouraged to call or return to the ED for worsening issues or problems and was encouraged to schedule a follow up appointment for continuing care.     The patient verbalized understanding of discharge instructions and prescriptions, all questions were answered. The patient has no further concerns at this time.

## 2013-08-07 NOTE — ED Notes (Signed)
Shift change report received from Linus Galas. Jackson, RN. Assuming care of pt.

## 2013-08-07 NOTE — ED Notes (Signed)
Verbal shift change report given to Nettie ElmSylvia RN (oncoming nurse) by Cristal Generousiffany RN (offgoing nurse). Report included the following information SBAR, ED Summary, Procedure Summary, MAR and Recent Results.

## 2013-08-07 NOTE — Progress Notes (Signed)
VASCULAR LAB    Preliminary report of the venous duplex exam of the LLE reveals:  All vessels appeared patent  No evidence of baker's cyst    Final report to follow    Estrella Deedsaniel Spivey, RVS

## 2013-08-07 NOTE — ED Notes (Signed)
.  See Nursing Assessment

## 2013-08-08 MED ORDER — BUTALBITAL-ACETAMINOPHEN-CAFFEINE 50 MG-325 MG-40 MG TAB
50-325-40 mg | ORAL_TABLET | ORAL | Status: AC | PRN
Start: 2013-08-08 — End: ?

## 2013-08-08 NOTE — Procedures (Signed)
[facility]  *** FINAL REPORT ***    Name: Katelyn [name], [name]  MRN: [id number]  DOB: 10 Apr 1960  HIS Order #: [id number]  TRAKnet Visit #: [id number]  Date: 07 Aug 2013    TYPE OF TEST: Peripheral Venous Testing    REASON FOR TEST  Pain in limb, Limb swelling    Left Leg:-  Deep venous thrombosis:           No  Superficial venous thrombosis:    No  Deep venous insufficiency:        Not examined  Superficial venous insufficiency: Not examined      INTERPRETATION/FINDINGS  Left leg :  1. Deep vein(s) visualized include the common femoral, proximal  femoral, mid femoral, distal femoral, popliteal(above knee),  popliteal(fossa), popliteal(below knee), posterior tibial and peroneal   veins.  2. No evidence of deep venous thrombosis detected in the veins  visualized.  3. No evidence of deep vein thrombosis in the contralateral common  femoral vein.  4. Superficial vein(s) visualized include the great saphenous vein.  5. No evidence of superficial thrombosis detected.    ADDITIONAL COMMENTS    I have personally reviewed the data relevant to the interpretation of  this  study.    TECHNOLOGIST: Estrella Deedsaniel Spivey, RVS  Signed: 08/08/2013 07:59 AM    PHYSICIAN: Sharlene DoryMohammad S. Delbert Phenixhaudhry, MD  Signed: 08/08/2013 11:17 AM

## 2013-08-08 NOTE — Procedures (Signed)
[facility]  *** FINAL REPORT ***    Name: [name], Katelyn  MRN: [id number]  DOB: 10 Apr 1960  HIS Order #: [id number]  TRAKnet Visit #: [id number]  Date: 07 Aug 2013    TYPE OF TEST: Peripheral Venous Testing    REASON FOR TEST  Pain in limb, Limb swelling    Left Leg:-  Deep venous thrombosis:           No  Superficial venous thrombosis:    No  Deep venous insufficiency:        Not examined  Superficial venous insufficiency: Not examined      INTERPRETATION/FINDINGS  Left leg :  1. Deep vein(s) visualized include the common femoral, proximal  femoral, mid femoral, distal femoral, popliteal(above knee),  popliteal(fossa), popliteal(below knee), posterior tibial and peroneal   veins.  2. No evidence of deep venous thrombosis detected in the veins  visualized.  3. No evidence of deep vein thrombosis in the contralateral common  femoral vein.  4. Superficial vein(s) visualized include the great saphenous vein.  5. No evidence of superficial thrombosis detected.    ADDITIONAL COMMENTS    I have personally reviewed the data relevant to the interpretation of  this  study.    TECHNOLOGIST: Daniel Spivey, RVS  Signed: 08/08/2013 07:59 AM    PHYSICIAN: Shanyia Stines S. Apollos Tenbrink, MD  Signed: 08/08/2013 11:17 AM

## 2014-01-25 ENCOUNTER — Emergency Department: Payer: Self-pay | Admitting: Emergency Medicine

## 2014-06-01 ENCOUNTER — Inpatient Hospital Stay
Admit: 2014-06-01 | Discharge: 2014-06-01 | Disposition: A | Discharge status: Home / Self Care | Payer: MEDICARE | Attending: Emergency Medicine

## 2014-06-01 DIAGNOSIS — K027 Dental root caries: Secondary | ICD-10-CM

## 2014-06-01 MED ORDER — NAPROXEN 500 MG TAB
500 mg | ORAL_TABLET | Freq: Two times a day (BID) | ORAL | Status: AC | PRN
Start: 2014-06-01 — End: ?

## 2014-06-01 MED ORDER — PENICILLIN V-K 500 MG TAB
500 mg | ORAL_TABLET | Freq: Four times a day (QID) | ORAL | Status: AC
Start: 2014-06-01 — End: 2014-06-08

## 2014-06-01 MED ORDER — BUTAMBEN-TETRACAINE-BENZOCAINE 2 %-2 %-14 % TOPICAL SPRAY
2-2-14200 %- %-14 % (00 mg/sec) | Freq: Once | CUTANEOUS | Status: AC
Start: 2014-06-01 — End: 2014-06-01
  Administered 2014-06-01: 17:00:00

## 2014-06-01 MED FILL — DIPHENHYDRAMINE 12.5 MG/5 ML ELIXIR: 12.5 mg/5 mL | ORAL | Qty: 20

## 2014-06-01 NOTE — ED Notes (Signed)
Patient given printed discharge instructions and two script(s).  Pt verbalized understanding of instructions and script(s).  Patient verbalized importance of following up with dentistry.  Patient alert and oriented, in no acute distress, ambulatory with self.

## 2014-06-01 NOTE — ED Notes (Signed)
Emergency Department Nursing Plan of Care       The Nursing Plan of Care is developed from the Nursing assessment and Emergency Department Attending provider initial evaluation.  The plan of care may be reviewed in the ???ED Provider note???.    The Plan of Care was developed with the following considerations:   Patient / Family readiness to learn indicated ZO:[id number]:verbalized understanding  Persons(s) to be included in education: patient  Barriers to Learning/Limitations:No    Signed     Rolena InfanteKatherine L Guilford    06/01/2014   1:07 PM

## 2014-06-01 NOTE — ED Provider Notes (Signed)
HPI Comments: Pt reports decay in tooth prior to injury; states she bit down on a piece of hard candy yesterday and chipped her tooth. Pain has been increasing.     Patient is a 54 y.o. female presenting with dental problem and headaches. The history is provided by the patient.   Dental Pain   This is a new problem. The problem occurs constantly. The problem has been gradually worsening. The pain is located in the left upper mouth.The quality of the pain is sharp and aching.  The pain is at a severity of 8/10. Associated symptoms include nausea and headaches.There was no vomiting, no fever, no swelling, no chest pain, no gum redness and no drainage. Treatments tried: ibuprofen.   Headache   Pertinent negatives include no fever, no palpitations, no shortness of breath, no dizziness, no nausea and no vomiting.        Past Medical History:   Diagnosis Date   ??? Psychiatric disorder      Depression, bipolar   ??? Hypertension        Past Surgical History:   Procedure Laterality Date   ??? Hx gyn       5 C-Section   ??? Hx hysterectomy     ??? Hx cholecystectomy           Family History:   Problem Relation Age of Onset   ??? Cancer Maternal Aunt      breast       History     Social History   ??? Marital Status: SINGLE     Spouse Name: N/A   ??? Number of Children: N/A   ??? Years of Education: N/A     Occupational History   ??? Not on file.     Social History Main Topics   ??? Smoking status: Never Smoker    ??? Smokeless tobacco: Not on file   ??? Alcohol Use: No   ??? Drug Use: No   ??? Sexual Activity: No     Other Topics Concern   ??? Not on file     Social History Narrative           ALLERGIES: Tetanus vaccines & toxoid      Review of Systems   Constitutional: Negative for fever, chills, appetite change and fatigue.   HENT: Positive for dental problem. Negative for congestion, ear pain, facial swelling, sinus pressure and sore throat.    Eyes: Negative for visual disturbance.    Respiratory: Negative for cough, shortness of breath and wheezing.    Cardiovascular: Negative for chest pain and palpitations.   Gastrointestinal: Negative for nausea, vomiting, abdominal pain and diarrhea.   Neurological: Positive for headaches. Negative for dizziness and light-headedness.   All other systems reviewed and are negative.      Filed Vitals:    06/01/14 1155   BP: 135/79   Pulse: 89   Temp: 98.3 ??F (36.8 ??C)   Resp: 18   Height: 5\' 1"  (1.549 m)   Weight: 90.719 kg (200 lb)   SpO2: 97%            Physical Exam   Constitutional: She is oriented to person, place, and time. She appears well-developed and well-nourished. No distress.   HENT:   Head: Normocephalic and atraumatic.   Right Ear: External ear normal.   Left Ear: External ear normal.   Mouth/Throat: No oral lesions. Abnormal dentition. Dental caries present. No lacerations.       No swelling  at gumline   Eyes: Conjunctivae and EOM are normal. Pupils are equal, round, and reactive to light. Right eye exhibits no discharge. Left eye exhibits no discharge.   Neck: Normal range of motion. Neck supple.   Cardiovascular: Normal rate, regular rhythm and normal heart sounds.  Exam reveals no gallop and no friction rub.    No murmur heard.  Pulmonary/Chest: Effort normal and breath sounds normal. She has no wheezes.   Musculoskeletal: Normal range of motion. She exhibits no edema or tenderness.   Lymphadenopathy:     She has no cervical adenopathy.   Neurological: She is alert and oriented to person, place, and time. No cranial nerve deficit.   Skin: Skin is warm and dry.   Psychiatric: She has a normal mood and affect. Her behavior is normal. Judgment normal.   Nursing note and vitals reviewed.       MDM  Number of Diagnoses or Management Options  Caries:   Pain, dental:   Diagnosis management comments: DDx: caries, dentalgia, periodontal disease       Amount and/or Complexity of Data Reviewed  Review and summarize past medical records: yes   Discuss the patient with other providers: yes        Procedures    PLAN    -- HOME with dental balls, naproxen and PCN  -- F/U with dentist    I have discussed with patient their results, diagnosis, treatment, and follow up plan. The patient agrees to follow up as outlined in discharge paperwork and also to return to the ED with any worsening. Ciro Backer, PA-C

## 2014-11-29 ENCOUNTER — Other Ambulatory Visit: Payer: Self-pay | Admitting: Primary Care

## 2014-11-29 DIAGNOSIS — Z1231 Encounter for screening mammogram for malignant neoplasm of breast: Secondary | ICD-10-CM

## 2014-12-06 ENCOUNTER — Ambulatory Visit: Payer: Self-pay | Attending: Primary Care

## 2015-01-15 ENCOUNTER — Ambulatory Visit: Payer: Self-pay | Attending: Primary Care

## 2015-06-08 ENCOUNTER — Emergency Department: Payer: Medicare Other

## 2015-06-08 ENCOUNTER — Encounter: Payer: Self-pay | Admitting: Emergency Medicine

## 2015-06-08 ENCOUNTER — Emergency Department
Admission: EM | Admit: 2015-06-08 | Discharge: 2015-06-08 | Disposition: A | Discharge status: Home / Self Care | Payer: Medicare Other | Attending: Emergency Medicine | Admitting: Emergency Medicine

## 2015-06-08 DIAGNOSIS — I10 Essential (primary) hypertension: Secondary | ICD-10-CM | POA: Diagnosis not present

## 2015-06-08 DIAGNOSIS — R42 Dizziness and giddiness: Secondary | ICD-10-CM | POA: Diagnosis present

## 2015-06-08 HISTORY — DX: Essential (primary) hypertension: I10

## 2015-06-08 LAB — URINALYSIS COMPLETE WITH MICROSCOPIC (ARMC ONLY)
BILIRUBIN URINE: NEGATIVE
GLUCOSE, UA: NEGATIVE mg/dL
KETONES UR: NEGATIVE mg/dL
Leukocytes, UA: NEGATIVE
NITRITE: NEGATIVE
PH: 6 (ref 5.0–8.0)
Protein, ur: NEGATIVE mg/dL
Specific Gravity, Urine: 1.01 (ref 1.005–1.030)

## 2015-06-08 LAB — BASIC METABOLIC PANEL
ANION GAP: 8 (ref 5–15)
BUN: 13 mg/dL (ref 6–20)
CALCIUM: 8.9 mg/dL (ref 8.9–10.3)
CO2: 26 mmol/L (ref 22–32)
Chloride: 109 mmol/L (ref 101–111)
Creatinine, Ser: 0.68 mg/dL (ref 0.44–1.00)
Glucose, Bld: 103 mg/dL — ABNORMAL HIGH (ref 65–99)
Potassium: 3.4 mmol/L — ABNORMAL LOW (ref 3.5–5.1)
SODIUM: 143 mmol/L (ref 135–145)

## 2015-06-08 LAB — CBC
HCT: 34.3 % — ABNORMAL LOW (ref 35.0–47.0)
HEMOGLOBIN: 11.2 g/dL — AB (ref 12.0–16.0)
MCH: 23.9 pg — ABNORMAL LOW (ref 26.0–34.0)
MCHC: 32.7 g/dL (ref 32.0–36.0)
MCV: 73.2 fL — ABNORMAL LOW (ref 80.0–100.0)
PLATELETS: 333 10*3/uL (ref 150–440)
RBC: 4.69 MIL/uL (ref 3.80–5.20)
RDW: 18.1 % — ABNORMAL HIGH (ref 11.5–14.5)
WBC: 9.9 10*3/uL (ref 3.6–11.0)

## 2015-06-08 LAB — TROPONIN I: Troponin I: <0.03 ng/mL (ref <0.031)

## 2015-06-08 MED ORDER — MECLIZINE HCL 25 MG PO TABS
25.0000 mg | ORAL_TABLET | Freq: Once | ORAL | Status: AC
  Administered 2015-06-08: 25 mg via ORAL
  Filled 2015-06-08: qty 1

## 2015-06-08 MED ORDER — MECLIZINE HCL 25 MG PO TABS: 25.0000 mg | ORAL_TABLET | Freq: Three times a day (TID) | ORAL | Status: DC | PRN

## 2015-06-08 NOTE — ED Provider Notes (Signed)
[facility] Emergency Department Provider Note    ____________________________________________  Time seen:~1100  I have reviewed the triage vital signs and the nursing notes.   HISTORY  Chief Complaint Dizziness   History limited by: Not Limited   HPI Elizabeth Gay is a 55 y.o. female whopresents to the emergency department today because of concerns for odd feelings. Patient states this been going on for about a month. She describes a feeling of "not quite whooziness" in her head. She states that she notices it when she stands up or moves suddenly. She denies any sense of the room spinning or vertigo which she states she has had in the past. She states that she has not had any nausea or vomiting with this. Denies any fevers. Denies any numbness or tingling in her arms or legs. She states she went to her primary care doctor and that they set her up with a neurology appointment however did not perform any tests. Today's episode was slightly worse and lasted a little bit longer and thus she came to the emergency department.   Past Medical History  Diagnosis Date  . Hypertension     There are no active problems to display for this patient.   Past Surgical History  Procedure Laterality Date  . Cesarean section      x5  . Abdominal hysterectomy      partial    No current outpatient prescriptions on file.  Allergies Review of patient's allergies indicates no known allergies.  History reviewed. No pertinent family history.  Social History Social History  Substance Use Topics  . Smoking status: Never Smoker   . Smokeless tobacco: None  . Alcohol Use: No    Review of Systems  Constitutional: Negative for fever. Cardiovascular: Negative for chest pain. Respiratory: Negative for shortness of breath. Gastrointestinal: Negative for abdominal pain, vomiting and diarrhea. Neurological: Negative for headaches, focal weakness or  numbness.   10-point ROS otherwise negative.  ____________________________________________   PHYSICAL EXAM:  VITAL SIGNS: ED Triage Vitals  Enc Vitals Group     BP 06/08/15 1013 130/72 mmHg     Pulse Rate 06/08/15 1013 96     Resp 06/08/15 1013 18     Temp 06/08/15 1013 98.1 F (36.7 C)     Temp Source 06/08/15 1013 Oral     SpO2 06/08/15 1013 97 %     Weight 06/08/15 1013 202 lb (91.627 kg)     Height 06/08/15 1013 5\' 1"  (1.549 m)     Head Cir --      Peak Flow --      Pain Score 06/08/15 1022 0   Constitutional: Alert and oriented. Anxious, crying. Eyes: Conjunctivae are normal. PERRL. Normal extraocular movements. ENT   Head: Normocephalic and atraumatic.   Nose: No congestion/rhinnorhea.   Mouth/Throat: Mucous membranes are moist.   Neck: No stridor. Hematological/Lymphatic/Immunilogical: No cervical lymphadenopathy. Cardiovascular: Normal rate, regular rhythm.  No murmurs, rubs, or gallops. Respiratory: Normal respiratory effort without tachypnea nor retractions. Breath sounds are clear and equal bilaterally. No wheezes/rales/rhonchi. Gastrointestinal: Soft and nontender. No distention.  Genitourinary: Deferred Musculoskeletal: Normal range of motion in all extremities. No joint effusions.  No lower extremity tenderness nor edema. Neurologic:  Normal speech and language. No gross focal neurologic deficits are appreciated.  Skin:  Skin is warm, dry and intact. No rash noted. Psychiatric: Anxious appearing, crying and tearful.  ____________________________________________    LABS (pertinent positives/negatives)  Labs Reviewed  BASIC METABOLIC PANEL -  Abnormal; Notable for the following:    Potassium 3.4 (*)    Glucose, Bld 103 (*)    All other components within normal limits  CBC - Abnormal; Notable for the following:    Hemoglobin 11.2 (*)    HCT 34.3 (*)    MCV 73.2 (*)    MCH 23.9 (*)    RDW 18.1 (*)    All other components within normal  limits  URINALYSIS COMPLETEWITH MICROSCOPIC ([facility] ONLY) - Abnormal; Notable for the following:    Color, Urine YELLOW (*)    APPearance CLEAR (*)    Hgb urine dipstick 1+ (*)    Bacteria, UA FEW (*)    Squamous Epithelial / LPF 0-5 (*)    All other components within normal limits  TROPONIN I     ____________________________________________   EKG  I, Nance Pear, attending physician, personally viewed and interpreted this EKG  EKG Time: 1116 Rate: 85 Rhythm: normal sinus rhythm Axis: normal Intervals: qtc 452 QRS: narrow ST changes: no st elevation Impression: normal ekg    ____________________________________________    RADIOLOGY  CT head IMPRESSION: No evidence of acute intracranial abnormality.  ____________________________________________   PROCEDURES  Procedure(s) performed: None  Critical Care performed: No  ____________________________________________   INITIAL IMPRESSION / ASSESSMENT AND PLAN / ED COURSE  Pertinent labs & imaging results that were available during my care of the patient were reviewed by me and considered in my medical decision making (see chart for details).  Patient presented to the emergency department today because of concerns for dizziness with standing. Workup today without any concerning findings. At this point unclear etiology. I doubt central lesion given the intermittent nature. Patient does have follow-up already scheduled with neurology. I encourage patient to keep this appointment. Additionally will try meclizine to see if this helps with the symptoms since there does appear her to be a positional component.  ____________________________________________   FINAL CLINICAL IMPRESSION(S) / ED DIAGNOSES  Final diagnoses:  Dizziness     Nance Pear, MD 06/08/15 1420

## 2015-06-08 NOTE — ED Notes (Addendum)
Pt reports "weird feeling in my head" for the past month.  Pt states today she reached for her phone and felt dizzy. Pt states she feels so funny she can't explain how she is feeling.  Pt states when she gets up from a lying position she has the feeling.  Pt denies feeling like she is having vertigo sxs and states she is having the opposite effect. Pt states she has been seen by PCP and has an appt with neurology in May.

## 2015-06-08 NOTE — Discharge Instructions (Signed)
Please seek medical attention for any high fevers, chest pain, shortness of breath, change in behavior, persistent vomiting, bloody stool or any other new or concerning symptoms.   Dizziness Dizziness is a common problem. It makes you feel unsteady or lightheaded. You may feel like you are about to pass out (faint). Dizziness can lead to injury if you stumble or fall. Anyone can get dizzy, but dizziness is more common in older adults. This condition can be caused by a number of things, including:  Medicines.  Dehydration.  Illness. HOME CARE Following these instructions may help with your condition: Eating and Drinking  Drink enough fluid to keep your pee (urine) clear or pale yellow. This helps to keep you from getting dehydrated. Try to drink more clear fluids, such as water.  Do not drink alcohol.  Limit how much caffeine you drink or eat if told by your doctor.  Limit how much salt you drink or eat if told by your doctor. Activity  Avoid making quick movements.  When you stand up from sitting in a chair, steady yourself until you feel okay.  In the morning, first sit up on the side of the bed. When you feel okay, stand slowly while you hold onto something. Do this until you know that your balance is fine.  Move your legs often if you need to stand in one place for a long time. Tighten and relax your muscles in your legs while you are standing.  Do not drive or use heavy machinery if you feel dizzy.  Avoid bending down if you feel dizzy. Place items in your home so that they are easy for you to reach without leaning over. Lifestyle  Do not use any tobacco products, including cigarettes, chewing tobacco, or electronic cigarettes. If you need help quitting, ask your doctor.  Try to lower your stress level, such as with yoga or meditation. Talk with your doctor if you need help. General Instructions  Watch your dizziness for any changes.  Take medicines only as told by  your doctor. Talk with your doctor if you think that your dizziness is caused by a medicine that you are taking.  Tell a friend or a family member that you are feeling dizzy. If he or she notices any changes in your behavior, have this person call your doctor.  Keep all follow-up visits as told by your doctor. This is important. GET HELP IF:  Your dizziness does not go away.  Your dizziness or light-headedness gets worse.  You feel sick to your stomach (nauseous).  You have trouble hearing.  You have new symptoms.  You are unsteady on your feet or you feel like the room is spinning. GET HELP RIGHT AWAY IF:  You throw up (vomit) or have diarrhea and are unable to eat or drink anything.  You have trouble:  Talking.  Walking.  Swallowing.  Using your arms, hands, or legs.  You feel generally weak.  You are not thinking clearly or you have trouble forming sentences. It may take a friend or family member to notice this.  You have:  Chest pain.  Pain in your belly (abdomen).  Shortness of breath.  Sweating.  Your vision changes.  You are bleeding.  You have a headache.  You have neck pain or a stiff neck.  You have a fever.   This information is not intended to replace advice given to you by your health care provider. Make sure you discuss any questions you  have with your health care provider.   Document Released: 01/21/2011 Document Revised: 06/18/2014 Document Reviewed: 01/28/2014 Elsevier Interactive Patient Education [employer].

## 2015-06-08 NOTE — ED Notes (Addendum)
Patient c/o dizziness with standing that has been going on for 4 months, patient states that today it was worse. Patient states that she has a hx/o migraines and vertigo but this feels different. Patient denies pain and numbness or tingling in her arms in legs. Denies N/V/D. Patient has not tried any OTC medications for her symptoms.   Patient states that she has been seen by her PCP and that she also has an appointment with neurology.   Patient tearful and anxious at this time

## 2015-07-22 ENCOUNTER — Ambulatory Visit: Payer: Medicare Other | Admitting: Physical Therapy

## 2015-07-25 ENCOUNTER — Ambulatory Visit: Payer: Medicare Other | Attending: Neurology

## 2015-07-25 DIAGNOSIS — R42 Dizziness and giddiness: Secondary | ICD-10-CM | POA: Diagnosis not present

## 2015-07-25 NOTE — Therapy (Signed)
[place] MAIN [facility] SERVICES [address] [place], [place], [address] Phone: [phone]   Fax:  [phone]  Physical Therapy Evaluation  Patient Details  Name: Elizabeth Gay MRN: [id number] Date of Birth: Jul 18, 1960 Referring Provider: Gurney Maxin  Encounter Date: 07/25/2015      PT End of Session - 07/25/15 0938    Visit Number 1   Number of Visits 7   Date for PT Re-Evaluation 09/21/2015   Authorization Type g codes every 10th/30 days   PT Start Time 0810   PT Stop Time 0910   PT Time Calculation (min) 60 min   Equipment Utilized During Treatment Gait belt   Activity Tolerance Patient tolerated treatment well   Behavior During Therapy [facility] for tasks assessed/performed      Past Medical History  Diagnosis Date  . Hypertension     Past Surgical History  Procedure Laterality Date  . Cesarean section      x5  . Abdominal hysterectomy      partial    There were no vitals filed for this visit.       Subjective Assessment - 07/25/15 0936    Subjective "I feel like my brain slammed back in my head."   Pertinent History Elizabeth Gay is a 55 y.o. female who was referred by neurology for dizziness. Pt reports that on 06/08/15 she was laying down when she reached to the L for her remote and she felt like "my brain went forward and slammed back in place." Pt states that she didn't have pain or dizziness when that occurred but she felt scared. She went to the ER for these symptoms on this date. ED note from 06/08/15 states that she reported the symptoms had been ongoing for about a month, was seen by her PCP and referred to neurology. Pt reports that she felt unwell in the ED but had no further reproduction of her symptoms. She does report however that when she laid down for her CAT scan the room started spinning. No etiology was found for her symptoms in the ED. She was prescribed meclizine and encouraged to follow-up with neurology who  gave her the modified Semont maneuver at home. She reports no changes with this exercise. Pt reports she has had no further symptoms since 06/08/15. She states that in the morning if she wakes up and "jumps up really fast" she "starts feeling crazy." Unfortunately she is unable to describe these symptoms any further. "It's just a weird feeling that comes over me." She denies any presyncopal symptoms. Pt denies prior history of similar symptoms. She reports having vertigo in the past approximately 5 times, last episode was in 2007. At that time she describes symptoms as "the room spinning." She has a history of migraines and reports she had a migraine the day she was in the ER. However, pt denies any vestibular symptoms with her migraines in the past.    Currently in Pain? No/denies      VESTIBULAR AND BALANCE EVALUATION  Onset Date: 06/08/15?  HISTORY:  Subjective history of current problem:  Elizabeth Gay is a 55 y.o. female who was referred by neurology for dizziness. Pt reports that on 06/08/15 she was laying down when she reached to the L for her remote and she felt like "my brain went forward and slammed back in place." Pt states that she didn't have pain or dizziness when that occurred but she felt scared. She went to the ER for these  symptoms on this date. ED note from 06/08/15 states that she reported the symptoms had been ongoing for about a month, was seen by her PCP and referred to neurology. Pt reports that she felt unwell in the ED but had no further reproduction of her symptoms. She does report however that when she laid down for her CAT scan the room started spinning. No etiology was found for her symptoms in the ED. She was prescribed meclizine and encouraged to follow-up with neurology who gave her the modified Semont maneuver at home. She reports no changes with this exercise. Pt reports she has had no further symptoms since 06/08/15. She states that in the morning if she wakes up and "jumps  up really fast" she "starts feeling crazy." Unfortunately she is unable to describe these symptoms any further. "It's just a weird feeling that comes over me." She denies any presyncopal symptoms. Pt denies prior history of similar symptoms. She reports having vertigo in the past approximately 5 times, last episode was in 2007. At that time she describes symptoms as "the room spinning." She has a history of migraines and reports she had a migraine the day she was in the ER. However, pt denies any vestibular symptoms with her migraines in the past.   Description of dizziness: (vertigo, unsteadiness, lightheadedness, falling, general unsteadiness, aural fullness): No symptoms currently. At time of symptoms pt reports it felt like "my head slammed back in my head." She describes one episode of vertigo while laying down in ER for CAT scan Frequency: No further episodes Duration: The day of the episode symptoms lasted approximately 2 hours Symptom nature: (motion provoked/positional/spontaneous/constant, variable, intermittent): No further symptoms. Appears maybe to be motion provoked   Provocative Factors: If she turns her head quickly she reports feeling "imbalanced" Easing Factors: Meclizine  Progression of symptoms: (better, worse, no change since onset): Better, resolved History of similar episodes: No. Pt has history of vertigo (last episode 2007) but this episode "is different."   Falls (yes/no): No  Number of falls in past 6 months: No  Prior Functional Level: Independent with ADLs/IADLs. Full community ambulator without assistive device. Doesn't drive. 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 Vision (last eye exam, diplopia, recent changes): Denies, wears corrective lenses (last appt January).   Current Symptoms: One episode of vertigo  while in ED, weight gain, migraines (had a migraine during CT scan, lasted all day); Denies: N & V, dysarthria, dysphagia, drop attacks, bowel and bladder changes, recent weight loss/gain, lightheadedness, rocking, general unsteadiness, imbalance, tilting, swaying, veering, dizzy, oscillopsia.  Review of systems negative for red flags.   EXAMINATION  POSTURE: Overweight with truncal adiposity but otherwise no important postural abnormalities noted.   NEUROLOGICAL SCREEN: (2+ unless otherwise noted.) N=normal  Ab=abnormal  Level Dermatome R L Myotome R L Reflex R L  C3 Anterior Neck N N Sidebend C2-3 N N Jaw CN V    C4 Top of Shoulder N N Shoulder Shrug C4 N N Hoffman's UMN N N  C5 Lateral Upper Arm N N Shoulder ABD C4-5 N N Biceps C5-6 2 2   C6 Lateral Arm/ Thumb N N Arm Flex/ Wrist Ext C5-6 N N Brachiorad. C5-6 2 2   C7 Middle Finger N N Arm Ext//Wrist Flex C6-7 N N Triceps C7 2 2  C8 4th & 5th Finger N N Flex/ Ext [employer]  C8 N N Patella 3 3  T1 Medial Arm N N Interossei T1 N N Gastrocnemius 2 2  L2 Medial thigh/groin N N Illiopsoas (L2-3) N N     L3 Lower thigh/med.knee N N Quadriceps (L3-4) N N     L4 Medial leg/lat thigh N N Tibialis Ant (L4-5) N N     L5 Lat. leg & dorsal foot N N EHL (L5) N N UE/LE clonus N N  S1 post/lat foot/thigh/leg N N Gastrocnemius (S1-2) N N     S2 Post./med. thigh & leg   Hamstrings (L4-S3) N N       SOMATOSENSORY:         Sensation           Intact      Diminished         Absent  Light touch N    Any N & T in extremities or weakness: Denies      COORDINATION: Finger to Nose: Normal Heel to shin: Normal Pronator Drift: Normal   MUSCULOSKELETAL SCREEN: Cervical Spine ROM:WNL and painless   ROM: Grossly WFL  MMT: WFL, at least 4+/5 throughout. Symmetrical  Functional Mobility: Independent with transfers without UE support.   Gait: Scanning of visual environment with gait is: Normal. No gross abnormalities in gait noted. Speed is grossly  [facility] and appropriate for age/gender.   OCULOMOTOR / VESTIBULAR TESTING:  Oculomotor Exam- Room Light  Normal Abnormal Comments  Ocular Alignment N    Ocular ROM N    Spontaneous Nystagmus N    End-Gaze Nystagmus N    Smooth Pursuit  A Mildly saccadic  Saccades N  Normal, one correction required, generally undershoot.   VOR  A Pt reports feeling mildly "whoozy"  VOR Cancellation  A Pt reports dizziness  Left Head Thrust  A Pt reports positive reproduction of symptoms. Difficult to observe due to startle but eyes appear to deviate.   Right Head Thrust  A Pt reports positive reproduction of symptoms. Difficult to observe due to startle but eyes appear to deviate.   Head Shaking Nystagmus N    Static Acuity     Dynamic Acuity       Oculomotor Exam- Fixation Suppressed  Normal Abnormal Comments  Ocular Alignment N    Ocular ROM N    Spontaneous Nystagmus N    End-Gaze Nystagmus N    Left Head Thrust     Right Head Thrust     Head Shaking Nystagmus       BPPV TESTS:  Symptoms Duration Intensity Nystagmus  L Dix-Hallpike "lightheaded," no positive reproduction of symptoms.    No  R Dix-Hallpike "lightheaded," no positive reproduction of symptoms.    No  L Head Roll None   No  R Head Roll None   No  L Sidelying Test      R Sidelying Test        FUNCTIONAL OUTCOME MEASURES:  Results Comments  DHI 34/100 In need of intervention  ABC Scale  Not completed  DGI 23/24 WNL  10 meter Walking Speed Full community ambulation speed [facility]    Orthostatic vitals signs: Supine: BP: 113/72, HR: 78, SaO2: 97% Seated: BP: 123/69  HR: 82 , SaO2: 99% Standing: BP: 113/72   HR: 88 SaO2: 96%         [facility] PT Assessment - 07/25/15 0001    Assessment   Medical Diagnosis Dizziness   Referring Provider Gurney Maxin   Onset Date/Surgical Date  06/08/15   Hand Dominance Right   Next MD Visit No further appt scheduled currently   Prior Therapy None   Precautions   Precautions None    Restrictions   Weight Bearing Restrictions No   Balance Screen   Has the patient fallen in the past 6 months No   Has the patient had a decrease in activity level because of a fear of falling?  No   Is the patient reluctant to leave their home because of a fear of falling?  No   Home Environment   Living Environment Private residence   Living Arrangements Alone   Available Help at Discharge Family   Type of [place] to enter   Entrance Stairs-Number of Steps 10   Entrance Stairs-Rails Right   [place] One level   [place] None   Prior Function   Level of Independence Independent   Vocation On disability   Leisure Pt likes to shop. She relies on friends and public transport for all appointments. Does not drive   Cognition   Overall Cognitive Status Within Functional Limits for tasks assessed   Observation/Other Assessments   Other Surveys  Other Surveys   Dizziness Handicap Inventory [facility])  34/100   Standardized Balance Assessment   Standardized Balance Assessment Dynamic Gait Index   Dynamic Gait Index   Level Surface Normal   Change in Gait Speed Normal   Gait with Horizontal Head Turns Normal   Gait with Vertical Head Turns Normal   Gait and Pivot Turn Normal   Step Over Obstacle Normal   Step Around Obstacles Normal   Steps Mild Impairment   Total Score 23   DGI comment: WFL       TREATMENT  VOR x 1 horizontal performed with patient 60 seconds x 2. Cues provided for technique and speed. Handout provided and explained. Pt reports 7/10 dizziness with first bout and 5/10 dizziness with second bout.                     PT Education - 07/25/15 [phone]    Education provided Yes   Education Details HEP and plan of care   Person(s) Educated Patient   Methods Explanation;Demonstration;Tactile cues;Verbal cues;Handout   Comprehension Verbalized understanding;Returned demonstration             PT Long Term Goals -  07/25/15 1003    PT LONG TERM GOAL #1   Title Pt will be independent with HEP to decrease dizziness and improve function at home   Time 6   Period Weeks   Status New   PT LONG TERM GOAL #2   Title Pt will reports <5/10 dizziness with VOR in sitting so she can perform quick head turns with household chores without significant dizziness   Baseline 07/25/15 Worst: 7/10 with VOR   Time 6   Period Weeks   Status New   PT LONG TERM GOAL #3   Title Pt will decrease DHI by at least 18 points in order to demonstrate clinically significant reduction in symptoms and improve her function at home   Baseline 07/25/15: 34/100   Time [place] - 07/25/15 1001    Clinical Impression Statement Elizabeth Gay is a 55 y.o. female who was referred by neurology for dizziness. She also had one ED visit  at [facility] and notes were reviewed. Pt reports that on at time of onset she was laying down when she reached to the L for her remote and she felt like "my brain went forward and slammed back in place." Pt reports she has had no further symptoms since 06/08/15. She has a difficult time describing her symptoms or providing details. She has a history of migraines and reports she had a migraine the day she was in the ER. However, pt denies any vestibular symptoms with her migraines in the past. Vestibular evaluation today reveals positive reproduction of symptoms with VOR as well as VOR head thrust bilaterally. Difficult to observe for gaze deviation with thrust due to startle and blink. Pt also with some central signs such as saccadic smooth pursuit as well as positive VOR cancellation testing. Negative BPPV testing on this date for both posterior and horizontal canals. Etiology difficult to determine but appears to at have a vestibular component. Pt will benefit from skilled vestibular physical therapy to decrease symptoms and maintain full function at home and with leisure  activities.    Rehab Potential Good   Clinical Impairments Affecting Rehab Potential Positive: motivation, improvement; Negative: Difficulty describing symptoms   PT Frequency 1x / week   PT Duration 6 weeks   PT Treatment/Interventions Canalith Repostioning;Traction;DME Instruction;Gait training;Stair training;Functional mobility training;Therapeutic activities;Therapeutic exercise;Balance training;Neuromuscular re-education;Patient/family education;Manual techniques;Vestibular;Cryotherapy;Electrical Stimulation;Moist Heat   PT Next Visit Plan Have pt complete ABC, perform BERG, perform mCTSIB, progress HEP   PT Home Exercise Plan VOR x 1 horizontal x 60 seconds, 3 times, 4 times/day   Consulted and Agree with Plan of Care Patient      Patient will benefit from skilled therapeutic intervention in order to improve the following deficits and impairments:  Dizziness  Visit Diagnosis: Dizziness and giddiness - Plan: PT plan of care cert/re-cert      G-Codes - [id number]    Functional Assessment Tool Used clinical judgement, DHI, DGI   Functional Limitation Mobility: Walking and moving around   Mobility: Walking and Moving Around Current Status [phone]) At least 20 percent but less than 40 percent impaired, limited or restricted   Mobility: Walking and Moving Around Goal Status [phone]) At least 1 percent but less than 20 percent impaired, limited or restricted       Problem List There are no active problems to display for this patient.   Phillips Grout PT, DPT   Mykeal Carrick 07/25/2015, 10:10 AM  [place] MAIN [facility] SERVICES [address] [place], [place], [address] Phone: [phone]   Fax:  [phone]  Name: Elizabeth Gay MRN: [id number] Date of Birth: 1960-05-05

## 2015-07-25 NOTE — Patient Instructions (Signed)
VOR x 1 horizontal, 60 seconds x 3, 4 times/day

## 2015-07-29 ENCOUNTER — Ambulatory Visit: Payer: Medicare Other | Admitting: Physical Therapy

## 2015-08-05 ENCOUNTER — Ambulatory Visit: Payer: Medicare Other | Admitting: Physical Therapy

## 2015-08-12 ENCOUNTER — Ambulatory Visit: Payer: Medicare Other

## 2015-08-12 VITALS — BP 126/81 | HR 81

## 2015-08-12 DIAGNOSIS — R42 Dizziness and giddiness: Secondary | ICD-10-CM | POA: Diagnosis not present

## 2015-08-12 NOTE — Patient Instructions (Addendum)
Single Leg - Eyes Open    Holding support, lift right leg while maintaining balance over other leg. Progress to removing hands from support surface for longer periods of time. Hold_30___ seconds total. Repeat __3__ times per session on each leg. Do _2___ sessions per day.  Feet Heel-Toe "Semi-Tandem", Head Motion - Eyes Open    With eyes open, right foot directly in front of the other, move head slowly: left and right for 60 seconds. Then move head and shoulder body (from the waist) left and right for 60 seconds. Switch feet and repeat. Perform 3 times with each foot forward.  Repeat ____ times per session. Do ____ sessions per day.  Copyright  VHI. All rights reserved.

## 2015-08-12 NOTE — Therapy (Signed)
[place] MAIN [facility] SERVICES [address] [place], [place], [address] Phone: [phone]   Fax:  [phone]  Physical Therapy Treatment  Patient Details  Name: Elizabeth Gay MRN: [id number] Date of Birth: 13-Aug-1960 Referring Provider: Gurney Maxin  Encounter Date: 08/12/2015      PT End of Session - 08/12/15 1123    Visit Number 2   Number of Visits 7   Date for PT Re-Evaluation September 25, 2015   Authorization Type g codes every 10th/30 days   PT Start Time 1120   PT Stop Time 1200   PT Time Calculation (min) 40 min   Equipment Utilized During Treatment Gait belt   Activity Tolerance Patient tolerated treatment well   Behavior During Therapy [facility] for tasks assessed/performed      Past Medical History  Diagnosis Date  . Hypertension     Past Surgical History  Procedure Laterality Date  . Cesarean section      x5  . Abdominal hysterectomy      partial    Filed Vitals:   08/12/15 1123  BP: 126/81  Pulse: 81  SpO2: 99%        Subjective Assessment - 08/12/15 1121    Subjective Pt reports she is feeling well on this date. She has had no further episodes of dizziness since 06/08/15. She has been performing HEP consistently. Pt states that the first couple days she would have dizziness with VOR x 1 horizontal exercise but not after that time. No specific questions or concerns at this time.    Pertinent History Elizabeth Gay is a 55 y.o. female who was referred by neurology for dizziness. Pt reports that on 06/08/15 she was laying down when she reached to the L for her remote and she felt like "my brain went forward and slammed back in place." Pt states that she didn't have pain or dizziness when that occurred but she felt scared. She went to the ER for these symptoms on this date. ED note from 06/08/15 states that she reported the symptoms had been ongoing for about a month, was seen by her PCP and referred to neurology. Pt reports  that she felt unwell in the ED but had no further reproduction of her symptoms. She does report however that when she laid down for her CAT scan the room started spinning. No etiology was found for her symptoms in the ED. She was prescribed meclizine and encouraged to follow-up with neurology who gave her the modified Semont maneuver at home. She reports no changes with this exercise. Pt reports she has had no further symptoms since 06/08/15. She states that in the morning if she wakes up and "jumps up really fast" she "starts feeling crazy." Unfortunately she is unable to describe these symptoms any further. "It's just a weird feeling that comes over me." She denies any presyncopal symptoms. Pt denies prior history of similar symptoms. She reports having vertigo in the past approximately 5 times, last episode was in 2007. At that time she describes symptoms as "the room spinning." She has a history of migraines and reports she had a migraine the day she was in the ER. However, pt denies any vestibular symptoms with her migraines in the past.    Currently in Pain? No/denies            [facility] PT Assessment - 08/12/15 1130    Observation/Other Assessments   Other Surveys  Other Surveys   Activities of Balance Confidence Scale (  ABC Scale)  86.875%   Standardized Balance Assessment   Standardized Balance Assessment Berg Balance Test   Berg Balance Test   Sit to Stand Able to stand without using hands and stabilize independently   Standing Unsupported Able to stand safely 2 minutes   Sitting with Back Unsupported but Feet Supported on Floor or Stool Able to sit safely and securely 2 minutes   Stand to Sit Sits safely with minimal use of hands   Transfers Able to transfer safely, minor use of hands   Standing Unsupported with Eyes Closed Able to stand 10 seconds safely   Standing Ubsupported with Feet Together Able to place feet together independently and stand 1 minute safely   From Standing, Reach  Forward with Outstretched Arm Can reach confidently >25 cm (10")   From Standing Position, Pick up Object from Floor Able to pick up shoe safely and easily   From Standing Position, Turn to Look Behind Over each Shoulder Looks behind from both sides and weight shifts well   Turn 360 Degrees Able to turn 360 degrees safely in 4 seconds or less   Standing Unsupported, Alternately Place Feet on Step/Stool Able to stand independently and safely and complete 8 steps in 20 seconds   Standing Unsupported, One Foot in Front Able to plae foot ahead of the other independently and hold 30 seconds   Standing on One Leg Able to lift leg independently and hold equal to or more than 3 seconds   Total Score 53       TREATMENT  Physical Performance Performed BERG with patient who scored 53/56, Pt completed ABC scale (unbilled); Performed mCTSIB: conditions 1-4: > 30 seconds;  Neuromuscular Re-education Reviewed and performed VOR x 1 horizontal in sitting 60 seconds x 1 (0/10), standing with feet together x 60 seconds (0/10), standing with feet together and conflicting background x 60 seconds (0/10); Airex feet together x 60 seconds (7/10); pt instructed to progress HEP to include standing with feet together and conflicting background; Single leg balance x 30 seconds total each leg (3 second bouts), added to HEP; Modified tandem progressions with vertical head turns x 60 seconds, horizontal head turns x 60 seconds, and horizontal head with body turns x 60 seconds. Each exercise performed with each LE forward; Intermittent seated rest break between repetitions due to fatigue. Cues and tactile input to correct exercise technique.  Pt provided written HEP with extensive descriptions about how to perform each exercise.                       PT Education - 08/12/15 1123    Education provided Yes   Education Details Progressed HEP   Person(s) Educated Patient   Methods  Explanation;Demonstration;Verbal cues;Handout;Tactile cues   Comprehension Verbalized understanding;Returned demonstration             PT Long Term Goals - 07/25/15 1003    PT LONG TERM GOAL #1   Title Pt will be independent with HEP to decrease dizziness and improve function at home   Time 6   Period Weeks   Status New   PT LONG TERM GOAL #2   Title Pt will reports <5/10 dizziness with VOR in sitting so she can perform quick head turns with household chores without significant dizziness   Baseline 07/25/15 Worst: 7/10 with VOR   Time 6   Period Weeks   Status New   PT LONG TERM GOAL #3   Title Pt  will decrease DHI by at least 18 points in order to demonstrate clinically significant reduction in symptoms and improve her function at home   Baseline 07/25/15: 34/100   Time 6   Period Weeks   Status New               Plan - 08/12/15 1123    Clinical Impression Statement Pt denies any further dizziness on this date. Able to illicit dizziness only with VOR x 1 hoirzontal on Airex pad. Pt scored 53/56 on BERG with deficits in tandem and single leg balance. Pt provided progression for HEP to include single leg balance and modified tandem with head turns/body turns. Will progress VOR at next appointment and attempt hallway ammbultion with ball passes. If pt with no further bouts of dizziness will consider discharge.    Rehab Potential Good   Clinical Impairments Affecting Rehab Potential Positive: motivation, improvement; Negative: Difficulty describing symptoms   PT Frequency 1x / week   PT Duration 6 weeks   PT Treatment/Interventions Canalith Repostioning;Traction;DME Instruction;Gait training;Stair training;Functional mobility training;Therapeutic activities;Therapeutic exercise;Balance training;Neuromuscular re-education;Patient/family education;Manual techniques;Vestibular;Cryotherapy;Electrical Stimulation;Moist Heat   PT Next Visit Plan Progress VOR x 1, hallway ambulation  with ball pass. If no further dizziness consider repeating [place] and discharge.    PT Home Exercise Plan VOR x 1 horizontal x 60 seconds, 3 times, 4 times/day, single leg balance, modified tandem with head/body turns   Consulted and Agree with Plan of Care Patient      Patient will benefit from skilled therapeutic intervention in order to improve the following deficits and impairments:  Dizziness  Visit Diagnosis: Dizziness and giddiness     Problem List There are no active problems to display for this patient.  Phillips Grout PT, DPT   Huprich,Jason 08/12/2015, 12:04 PM  [place] MAIN [facility] SERVICES [address] [place], [place], [address] Phone: [phone]   Fax:  [phone]  Name: Elizabeth Gay MRN: [id number] Date of Birth: 08-18-1960

## 2015-08-29 ENCOUNTER — Ambulatory Visit: Payer: Medicare Other | Admitting: Physical Therapy

## 2015-09-02 ENCOUNTER — Ambulatory Visit: Payer: Medicare Other | Attending: Neurology | Admitting: Physical Therapy

## 2015-09-02 ENCOUNTER — Encounter: Payer: Self-pay | Admitting: Physical Therapy

## 2015-09-02 DIAGNOSIS — R42 Dizziness and giddiness: Secondary | ICD-10-CM

## 2015-09-02 NOTE — Therapy (Signed)
[place] MAIN [facility] SERVICES [address] [place], [place], [address] Phone: [phone]   Fax:  [phone]  Physical Therapy Treatment/Discharge Summary  Patient Details  Name: Elizabeth Gay MRN: [id number] Date of Birth: 09-14-1960 Referring Provider: Gurney Maxin  Encounter Date: 09/02/2015      PT End of Session - 09/02/15 1125    Visit Number 3   Number of Visits 7   Date for PT Re-Evaluation 2015/09/15   Authorization Type g codes every 10th/30 days   PT Start Time 1120   PT Stop Time 1152   PT Time Calculation (min) 32 min   Equipment Utilized During Treatment Gait belt   Activity Tolerance Patient tolerated treatment well   Behavior During Therapy [facility] for tasks assessed/performed      Past Medical History  Diagnosis Date  . Hypertension     Past Surgical History  Procedure Laterality Date  . Cesarean section      x5  . Abdominal hysterectomy      partial    There were no vitals filed for this visit.      Subjective Assessment - 09/02/15 1122    Subjective Patient states she has been doing great. Pt reports she has not had any episodes of dizziness since 06/08/15. Pt states she hopes today will be her last day of therapy. Pt reports she has been doing her home exercise program but states she has not been doing semi-tandem stance becasue she does not have a foam pad. (Discussed that she could perform progressions on solid ground and standing on a pillow instead of foam pad.) Pt reports that she just joined the [facility] and that she walks for exercise.    Pertinent History Elizabeth Gay is a 55 y.o. female who was referred by neurology for dizziness. Pt reports that on 06/08/15 she was laying down when she reached to the L for her remote and she felt like "my brain went forward and slammed back in place." Pt states that she didn't have pain or dizziness when that occurred but she felt scared. She went to the ER for these  symptoms on this date. ED note from 06/08/15 states that she reported the symptoms had been ongoing for about a month, was seen by her PCP and referred to neurology. Pt reports that she felt unwell in the ED but had no further reproduction of her symptoms. She does report however that when she laid down for her CAT scan the room started spinning. No etiology was found for her symptoms in the ED. She was prescribed meclizine and encouraged to follow-up with neurology who gave her the modified Semont maneuver at home. She reports no changes with this exercise. Pt reports she has had no further symptoms since 06/08/15. She states that in the morning if she wakes up and "jumps up really fast" she "starts feeling crazy." Unfortunately she is unable to describe these symptoms any further. "It's just a weird feeling that comes over me." She denies any presyncopal symptoms. Pt denies prior history of similar symptoms. She reports having vertigo in the past approximately 5 times, last episode was in 2007. At that time she describes symptoms as "the room spinning." She has a history of migraines and reports she had a migraine the day she was in the ER. However, pt denies any vestibular symptoms with her migraines in the past.    Currently in Pain? --  none stated     Neuromuscular Re-education:  VOR exercise: In standing on firm surface, pt performed VOR X 1 horiz 2 reps of 1 minute each with conflicting background.  On firm surface: On firm surface performed slow marching with 5 second holds 20 reps with pt intermittently touching // bar with few fingers of one hand for support with S.  On firm surface semi-tandem progressions with alternate lead leg with and without horiz and vert head turns and body turns with S.   Ball toss over shoulder: Pt performed multiple 48' trials of forward ambulation while tossing ball over one shoulder with return catch over opposite shoulder varying the ball position to head,  shoulder and waist level to promote head turning and tilting with SBA. Patient with slow speed able to increase with cuing with no LOB or uneven steppage noted and patient denied dizziness with this activity.   Patient completed [place]  functional outcome measures Discussed test results and compared to prior testing Discussed community based balance and exercise programs including Silver [employer]neakers and discussed [employer] including classes.      PT Education - 09/02/15 1125    Education provided Yes   Education Details Reviewed home exercise program and discussed discharge plans. Discussed the [employer] as patient just joined and discussed the [employer] program. Added VOR with conflicting background and slow marching to HEP and reissued HEP.    Person(s) Educated Patient   Methods Explanation;Handout;Demonstration   Comprehension Verbalized understanding;Returned demonstration             PT Long Term Goals - 09/02/15 1124    PT LONG TERM GOAL #1   Title Pt will be independent with HEP to decrease dizziness and improve function at home   Time 6   Period Weeks   Status Achieved   PT LONG TERM GOAL #2   Title Pt will reports <5/10 dizziness with VOR in sitting so she can perform quick head turns with household chores without significant dizziness   Baseline 07/25/15 Worst: 7/10 with VOR; reported 0/10 09/02/15   Time 6   Period Weeks   Status Achieved   PT LONG TERM GOAL #3   Title Pt will decrease DHI by at least 18 points in order to demonstrate clinically significant reduction in symptoms and improve her function at home   Baseline 07/25/15: 34/100; scored 2/100 on 09/02/15   Time 6   Period Weeks   Status Achieved               Plan - 09/02/15 1125    Clinical Impression Statement Patient reports she has been doing great and that she has not had any further episodes of dizziness since 06/08/2015. Pt reports independence with HEP and states she joined the [employer]. Discussed discharge  plans with patient and the Silver Sneakers program at the [facility]. Patient has met all goals as set on POC and reports no dizziness symptoms. Pt improved from 34/100 on the [facility] to 2/100 this date. Patient in agreement with discharge from PT services at this time.    Rehab Potential Good   Clinical Impairments Affecting Rehab Potential Positive: motivation, improvement; Negative: Difficulty describing symptoms   PT Frequency 1x / week   PT Duration 6 weeks   PT Treatment/Interventions Canalith Repostioning;Traction;DME Instruction;Gait training;Stair training;Functional mobility training;Therapeutic activities;Therapeutic exercise;Balance training;Neuromuscular re-education;Patient/family education;Manual techniques;Vestibular;Cryotherapy;Electrical Stimulation;Moist Heat   PT Home Exercise Plan VOR x 1 horizontal with conflicting background in standing x 60 seconds, 3 times, 4 times/day, single leg balance, modified tandem with head/body turns  on firm and pillow, slow marching   Consulted and Agree with Plan of Care Patient      Patient will benefit from skilled therapeutic intervention in order to improve the following deficits and impairments:  Dizziness  Visit Diagnosis: Dizziness and giddiness       G-Codes - 09/27/2015 1126    Functional Assessment Tool Used clinical judgement, DHI   Functional Limitation Mobility: Walking and moving around   Mobility: Walking and Moving Around Goal Status [phone]) At least 1 percent but less than 20 percent impaired, limited or restricted   Mobility: Walking and Moving Around Discharge Status [phone]) At least 1 percent but less than 20 percent impaired, limited or restricted      Problem List There are no active problems to display for this patient.  Lady Deutscher PT, DPT Murphy,Dorriea September 27, 2015, 12:30 PM  [place] MAIN [facility] SERVICES [address] [place], [place], [address] Phone: [phone]   Fax:   [phone]  Name: Elizabeth Gay MRN: [id number] Date of Birth: 01/31/1961

## 2015-09-09 ENCOUNTER — Encounter: Payer: Medicare Other | Admitting: Physical Therapy

## 2015-09-16 ENCOUNTER — Encounter: Payer: Medicare Other | Admitting: Physical Therapy

## 2016-01-01 ENCOUNTER — Other Ambulatory Visit: Payer: Self-pay | Admitting: Gastroenterology

## 2016-01-01 DIAGNOSIS — R1310 Dysphagia, unspecified: Secondary | ICD-10-CM

## 2016-04-23 ENCOUNTER — Encounter: Payer: Self-pay | Admitting: *Deleted

## 2016-04-26 ENCOUNTER — Encounter: Admission: RE | Payer: Self-pay | Source: Ambulatory Visit

## 2016-04-26 ENCOUNTER — Ambulatory Visit: Admission: RE | Admit: 2016-04-26 | Payer: Medicare Other | Source: Ambulatory Visit | Admitting: Gastroenterology

## 2016-04-26 HISTORY — DX: Headache, unspecified: R51.9

## 2016-04-26 HISTORY — DX: Headache: R51

## 2016-04-26 HISTORY — DX: Personal history of urinary calculi: Z87.442

## 2016-04-26 HISTORY — DX: Bipolar disorder, unspecified: F31.9

## 2016-04-26 SURGERY — COLONOSCOPY WITH PROPOFOL
Anesthesia: General

## 2017-02-17 ENCOUNTER — Other Ambulatory Visit: Payer: Self-pay | Admitting: Primary Care

## 2017-02-17 DIAGNOSIS — M79601 Pain in right arm: Secondary | ICD-10-CM

## 2017-02-22 ENCOUNTER — Ambulatory Visit: Payer: Medicare HMO

## 2017-03-07 ENCOUNTER — Ambulatory Visit: Admission: RE | Admit: 2017-03-07 | Payer: Medicare HMO | Source: Ambulatory Visit

## 2017-03-29 ENCOUNTER — Inpatient Hospital Stay: Admission: RE | Admit: 2017-03-29 | Payer: Medicare Other | Source: Ambulatory Visit

## 2017-03-31 ENCOUNTER — Other Ambulatory Visit: Payer: Self-pay

## 2017-03-31 ENCOUNTER — Encounter
Admission: RE | Admit: 2017-03-31 | Discharge: 2017-03-31 | Disposition: A | Discharge status: Home / Self Care | Payer: Medicare HMO | Source: Ambulatory Visit | Attending: Orthopedic Surgery | Admitting: Orthopedic Surgery

## 2017-03-31 DIAGNOSIS — Z01812 Encounter for preprocedural laboratory examination: Secondary | ICD-10-CM | POA: Insufficient documentation

## 2017-03-31 HISTORY — DX: Gastro-esophageal reflux disease without esophagitis: K21.9

## 2017-03-31 HISTORY — DX: Depression, unspecified: F32.A

## 2017-03-31 HISTORY — DX: Major depressive disorder, single episode, unspecified: F32.9

## 2017-03-31 HISTORY — DX: Dyspnea, unspecified: R06.00

## 2017-03-31 HISTORY — DX: Prediabetes: R73.03

## 2017-03-31 LAB — BASIC METABOLIC PANEL
ANION GAP: 12 (ref 5–15)
BUN: 15 mg/dL (ref 6–20)
CALCIUM: 9.1 mg/dL (ref 8.9–10.3)
CO2: 26 mmol/L (ref 22–32)
Chloride: 106 mmol/L (ref 101–111)
Creatinine, Ser: 0.77 mg/dL (ref 0.44–1.00)
Glucose, Bld: 98 mg/dL (ref 65–99)
POTASSIUM: 4.2 mmol/L (ref 3.5–5.1)
SODIUM: 144 mmol/L (ref 135–145)

## 2017-03-31 LAB — CBC
HCT: 34 % — ABNORMAL LOW (ref 35.0–47.0)
Hemoglobin: 11.1 g/dL — ABNORMAL LOW (ref 12.0–16.0)
MCH: 24.2 pg — ABNORMAL LOW (ref 26.0–34.0)
MCHC: 32.6 g/dL (ref 32.0–36.0)
MCV: 74.2 fL — ABNORMAL LOW (ref 80.0–100.0)
PLATELETS: 359 10*3/uL (ref 150–440)
RBC: 4.58 MIL/uL (ref 3.80–5.20)
RDW: 18.2 % — ABNORMAL HIGH (ref 11.5–14.5)
WBC: 8.1 10*3/uL (ref 3.6–11.0)

## 2017-03-31 NOTE — Patient Instructions (Signed)
Your procedure is scheduled on: 04/07/17 Thurs Report to Same Day Surgery 2nd floor medical mall [facility] Entrance-take elevator on left to 2nd floor.  Check in with surgery information desk.) To find out your arrival time please call [phone] between 1PM - 3PM on 04/06/17 Wed  Remember: Instructions that are not followed completely may result in serious medical risk, up to and including death, or upon the discretion of your surgeon and anesthesiologist your surgery may need to be rescheduled.    _x___ 1. Do not eat food after midnight the night before your procedure. You may drink clear liquids up to 2 hours before you are scheduled to arrive at the hospital for your procedure.  Do not drink clear liquids within 2 hours of your scheduled arrival to the hospital.  Clear liquids include  --Water or Apple juice without pulp  --Clear carbohydrate beverage such as ClearFast or Gatorade  --Black Coffee or Clear Tea (No milk, no creamers, do not add anything to                  the coffee or Tea Type 1 and type 2 diabetics should only drink water.  No gum chewing or hard candies.     __x__ 2. No Alcohol for 24 hours before or after surgery.   __x__3. No Smoking or e-cigarettes for 24 prior to surgery.  Do not use any chewable tobacco products for at least 6 hour prior to surgery   ____  4. Bring all medications with you on the day of surgery if instructed.    __x__ 5. Notify your doctor if there is any change in your medical condition     (cold, fever, infections).    x___6. On the morning of surgery brush your teeth with toothpaste and water.  You may rinse your mouth with mouth wash if you wish.  Do not swallow any toothpaste or mouthwash.   Do not wear jewelry, make-up, hairpins, clips or nail polish.  Do not wear lotions, powders, or perfumes. You may wear deodorant.  Do not shave 48 hours prior to surgery. Men may shave face and neck.  Do not bring valuables to the hospital.     [facility] is not responsible for any belongings or valuables.               Contacts, dentures or bridgework may not be worn into surgery.  Leave your suitcase in the car. After surgery it may be brought to your room.  For patients admitted to the hospital, discharge time is determined by your                       treatment team.  _  Patients discharged the day of surgery will not be allowed to drive home.  You will need someone to drive you home and stay with you the night of your procedure.    Please read over the following fact sheets that you were given:   [facility] Preparing for Surgery and or MRSA Information   _x___ Take anti-hypertensive listed below, cardiac, seizure, asthma,     anti-reflux and psychiatric medicines. These include:  1. None  2.  3.  4.  5.  6.  ____Fleets enema or Magnesium Citrate as directed.   _x___ Use CHG Soap or sage wipes as directed on instruction sheet   ____ Use inhalers on the day of surgery and bring to hospital day of surgery  ____ Stop Metformin and Janumet 2 days prior to surgery.    ____ Take 1/2 of usual insulin dose the night before surgery and none on the morning     surgery.   _x___ Follow recommendations from Cardiologist, Pulmonologist or PCP regarding          stopping Aspirin, Coumadin, Plavix ,Eliquis, Effient, or Pradaxa, and Pletal.  X____Stop Anti-inflammatories such as Advil, Aleve, Ibuprofen, Motrin, Naproxen, Naprosyn, Goodies powders or aspirin products. OK to take Tylenol and                          Celebrex.   _x___ Stop supplements until after surgery.  But may continue Vitamin D, Vitamin B,       and multivitamin.   ____ Bring C-Pap to the hospital.

## 2017-03-31 NOTE — Pre-Procedure Instructions (Signed)
(  msec) 148   QRS Interval (msec) 84   QT Interval (msec) 390   QTc (msec) 432   Other Result Information  This result has an attachment that is not available.  Result Narrative  Normal sinus rhythm Minimal voltage criteria for LVH, may be normal variant Borderline ECG No previous ECGs available I reviewed and concur with this report. Electronically signed [email or web address], MD, Cristie Hem ([id number]) on 03/09/2017 10:28:07 AM  Status Results Details

## 2017-03-31 NOTE — Pre-Procedure Instructions (Signed)
Not compliant with taking medications ordered.

## 2017-04-07 ENCOUNTER — Ambulatory Visit: Payer: Medicare HMO | Admitting: Anesthesiology

## 2017-04-07 ENCOUNTER — Ambulatory Visit
Admission: RE | Admit: 2017-04-07 | Discharge: 2017-04-07 | Disposition: A | Discharge status: Home / Self Care | Payer: Medicare HMO | Source: Ambulatory Visit | Attending: Orthopedic Surgery | Admitting: Orthopedic Surgery

## 2017-04-07 ENCOUNTER — Encounter: Payer: Self-pay | Admitting: Anesthesiology

## 2017-04-07 ENCOUNTER — Encounter
Admission: RE | Disposition: A | Discharge status: Home / Self Care | Payer: Self-pay | Source: Ambulatory Visit | Attending: Orthopedic Surgery

## 2017-04-07 DIAGNOSIS — F319 Bipolar disorder, unspecified: Secondary | ICD-10-CM | POA: Insufficient documentation

## 2017-04-07 DIAGNOSIS — I10 Essential (primary) hypertension: Secondary | ICD-10-CM | POA: Diagnosis not present

## 2017-04-07 DIAGNOSIS — G43909 Migraine, unspecified, not intractable, without status migrainosus: Secondary | ICD-10-CM | POA: Diagnosis not present

## 2017-04-07 DIAGNOSIS — E669 Obesity, unspecified: Secondary | ICD-10-CM | POA: Diagnosis not present

## 2017-04-07 DIAGNOSIS — M2342 Loose body in knee, left knee: Secondary | ICD-10-CM | POA: Insufficient documentation

## 2017-04-07 DIAGNOSIS — Z79899 Other long term (current) drug therapy: Secondary | ICD-10-CM | POA: Diagnosis not present

## 2017-04-07 DIAGNOSIS — Z6838 Body mass index (BMI) 38.0-38.9, adult: Secondary | ICD-10-CM | POA: Insufficient documentation

## 2017-04-07 DIAGNOSIS — M1712 Unilateral primary osteoarthritis, left knee: Secondary | ICD-10-CM | POA: Insufficient documentation

## 2017-04-07 HISTORY — PX: KNEE ARTHROSCOPY: SHX127

## 2017-04-07 SURGERY — ARTHROSCOPY, KNEE
Anesthesia: General | Laterality: Left

## 2017-04-07 MED ORDER — MIDAZOLAM HCL 2 MG/2ML IJ SOLN
INTRAMUSCULAR | Status: AC
  Filled 2017-04-07: qty 2

## 2017-04-07 MED ORDER — LIDOCAINE HCL (CARDIAC) 20 MG/ML IV SOLN
INTRAVENOUS | Status: DC | PRN
  Administered 2017-04-07: 60 mg via INTRAVENOUS

## 2017-04-07 MED ORDER — SEVOFLURANE IN SOLN
RESPIRATORY_TRACT | Status: AC
  Filled 2017-04-07: qty 250

## 2017-04-07 MED ORDER — MIDAZOLAM HCL 5 MG/5ML IJ SOLN
INTRAMUSCULAR | Status: DC | PRN
  Administered 2017-04-07: 2 mg via INTRAVENOUS

## 2017-04-07 MED ORDER — BUPIVACAINE-EPINEPHRINE (PF) 0.5% -1:200000 IJ SOLN
INTRAMUSCULAR | Status: DC | PRN
  Administered 2017-04-07: 30 mL via PERINEURAL

## 2017-04-07 MED ORDER — FAMOTIDINE 20 MG PO TABS
ORAL_TABLET | ORAL | Status: AC
  Administered 2017-04-07: 20 mg via ORAL
  Filled 2017-04-07: qty 1

## 2017-04-07 MED ORDER — DEXAMETHASONE SODIUM PHOSPHATE 10 MG/ML IJ SOLN
INTRAMUSCULAR | Status: AC
  Filled 2017-04-07: qty 1

## 2017-04-07 MED ORDER — PROPOFOL 10 MG/ML IV BOLUS
INTRAVENOUS | Status: DC | PRN
  Administered 2017-04-07: 180 mg via INTRAVENOUS

## 2017-04-07 MED ORDER — FENTANYL CITRATE (PF) 100 MCG/2ML IJ SOLN
INTRAMUSCULAR | Status: AC
  Administered 2017-04-07: 25 ug via INTRAVENOUS
  Filled 2017-04-07: qty 2

## 2017-04-07 MED ORDER — ONDANSETRON HCL 4 MG/2ML IJ SOLN
INTRAMUSCULAR | Status: AC
  Filled 2017-04-07: qty 2

## 2017-04-07 MED ORDER — DEXAMETHASONE SODIUM PHOSPHATE 10 MG/ML IJ SOLN
INTRAMUSCULAR | Status: DC | PRN
  Administered 2017-04-07: 5 mg via INTRAVENOUS

## 2017-04-07 MED ORDER — FENTANYL CITRATE (PF) 100 MCG/2ML IJ SOLN
INTRAMUSCULAR | Status: AC
  Filled 2017-04-07: qty 2

## 2017-04-07 MED ORDER — FENTANYL CITRATE (PF) 100 MCG/2ML IJ SOLN
25.0000 ug | INTRAMUSCULAR | Status: AC | PRN
  Administered 2017-04-07 (×6): 25 ug via INTRAVENOUS

## 2017-04-07 MED ORDER — GLYCOPYRROLATE 0.2 MG/ML IJ SOLN
INTRAMUSCULAR | Status: DC | PRN
  Administered 2017-04-07: 0.2 mg via INTRAVENOUS

## 2017-04-07 MED ORDER — KETOROLAC TROMETHAMINE 30 MG/ML IJ SOLN
INTRAMUSCULAR | Status: AC
  Filled 2017-04-07: qty 1

## 2017-04-07 MED ORDER — METOCLOPRAMIDE HCL 10 MG PO TABS: 5.0000 mg | ORAL_TABLET | Freq: Three times a day (TID) | ORAL | Status: DC | PRN

## 2017-04-07 MED ORDER — SODIUM CHLORIDE 0.9 % IV SOLN: INTRAVENOUS | Status: DC

## 2017-04-07 MED ORDER — ONDANSETRON HCL 4 MG PO TABS: 4.0000 mg | ORAL_TABLET | Freq: Four times a day (QID) | ORAL | Status: DC | PRN

## 2017-04-07 MED ORDER — HYDROCODONE-ACETAMINOPHEN 5-325 MG PO TABS
ORAL_TABLET | ORAL | Status: AC
  Filled 2017-04-07: qty 1

## 2017-04-07 MED ORDER — LIDOCAINE HCL (PF) 2 % IJ SOLN
INTRAMUSCULAR | Status: AC
  Filled 2017-04-07: qty 10

## 2017-04-07 MED ORDER — HYDROCODONE-ACETAMINOPHEN 5-325 MG PO TABS: 1.0000 | ORAL_TABLET | Freq: Four times a day (QID) | ORAL | 0 refills | Status: DC | PRN

## 2017-04-07 MED ORDER — FAMOTIDINE 20 MG PO TABS
20.0000 mg | ORAL_TABLET | Freq: Once | ORAL | Status: AC
  Administered 2017-04-07: 20 mg via ORAL

## 2017-04-07 MED ORDER — PROPOFOL 10 MG/ML IV BOLUS
INTRAVENOUS | Status: AC
  Filled 2017-04-07: qty 20

## 2017-04-07 MED ORDER — LACTATED RINGERS IV SOLN
INTRAVENOUS | Status: DC
  Administered 2017-04-07: 14:00:00 via INTRAVENOUS

## 2017-04-07 MED ORDER — PROPOFOL 10 MG/ML IV BOLUS
INTRAVENOUS | Status: AC
Start: 2017-04-07 — End: ?
  Filled 2017-04-07: qty 20

## 2017-04-07 MED ORDER — ONDANSETRON HCL 4 MG/2ML IJ SOLN: 4.0000 mg | Freq: Four times a day (QID) | INTRAMUSCULAR | Status: DC | PRN

## 2017-04-07 MED ORDER — ONDANSETRON HCL 4 MG/2ML IJ SOLN
INTRAMUSCULAR | Status: DC | PRN
  Administered 2017-04-07: 4 mg via INTRAVENOUS

## 2017-04-07 MED ORDER — FENTANYL CITRATE (PF) 100 MCG/2ML IJ SOLN
INTRAMUSCULAR | Status: DC | PRN
  Administered 2017-04-07 (×2): 25 ug via INTRAVENOUS
  Administered 2017-04-07: 50 ug via INTRAVENOUS
  Administered 2017-04-07 (×4): 25 ug via INTRAVENOUS

## 2017-04-07 MED ORDER — HYDROCODONE-ACETAMINOPHEN 5-325 MG PO TABS
1.0000 | ORAL_TABLET | ORAL | Status: DC | PRN
  Administered 2017-04-07: 1 via ORAL

## 2017-04-07 MED ORDER — ONDANSETRON HCL 4 MG/2ML IJ SOLN: 4.0000 mg | Freq: Once | INTRAMUSCULAR | Status: DC | PRN

## 2017-04-07 MED ORDER — METOCLOPRAMIDE HCL 5 MG/ML IJ SOLN: 5.0000 mg | Freq: Three times a day (TID) | INTRAMUSCULAR | Status: DC | PRN

## 2017-04-07 SURGICAL SUPPLY — 25 items
BANDAGE ACE 4X5 VEL STRL LF (GAUZE/BANDAGES/DRESSINGS) IMPLANT
BLADE INCISOR PLUS 4.5 (BLADE) IMPLANT
CHLORAPREP W/TINT 26ML (MISCELLANEOUS) ×3 IMPLANT
CUFF TOURN 24 STER (MISCELLANEOUS) IMPLANT
CUFF TOURN 30 STER DUAL PORT (MISCELLANEOUS) ×3 IMPLANT
GAUZE SPONGE 4X4 12PLY STRL (GAUZE/BANDAGES/DRESSINGS) ×3 IMPLANT
GLOVE SURG SYN 9.0  PF PI (GLOVE) ×2
GLOVE SURG SYN 9.0 PF PI (GLOVE) ×1 IMPLANT
GOWN SRG 2XL LVL 4 RGLN SLV (GOWNS) ×1 IMPLANT
GOWN STRL NON-REIN 2XL LVL4 (GOWNS) ×2
GOWN STRL REUS W/ TWL LRG LVL3 (GOWN DISPOSABLE) ×2 IMPLANT
GOWN STRL REUS W/TWL LRG LVL3 (GOWN DISPOSABLE) ×4
IV LACTATED RINGER IRRG 3000ML (IV SOLUTION) ×4
IV LR IRRIG 3000ML ARTHROMATIC (IV SOLUTION) ×2 IMPLANT
KIT TURNOVER KIT A (KITS) ×3 IMPLANT
MANIFOLD NEPTUNE II (INSTRUMENTS) ×3 IMPLANT
PACK ARTHROSCOPY KNEE (MISCELLANEOUS) ×3 IMPLANT
SCALPEL PROTECTED #11 DISP (BLADE) ×3 IMPLANT
SET TUBE SUCT SHAVER OUTFL 24K (TUBING) ×3 IMPLANT
SET TUBE TIP INTRA-ARTICULAR (MISCELLANEOUS) ×3 IMPLANT
SUT ETHILON 4-0 (SUTURE) ×2
SUT ETHILON 4-0 FS2 18XMFL BLK (SUTURE) ×1
SUTURE ETHLN 4-0 FS2 18XMF BLK (SUTURE) ×1 IMPLANT
TUBING ARTHRO INFLOW-ONLY STRL (TUBING) ×3 IMPLANT
WAND COBLATION FLOW 50 (SURGICAL WAND) ×3 IMPLANT

## 2017-04-07 NOTE — Anesthesia Preprocedure Evaluation (Addendum)
Anesthesia Evaluation  Patient identified by MRN, date of birth, ID band Patient awake    Reviewed: Allergy & Precautions, NPO status , Patient's Chart, lab work & pertinent test results, reviewed documented beta blocker date and time   Airway Mallampati: III  TM Distance: >3 FB     Dental  (+) Chipped, Poor Dentition, Missing, Dental Advisory Given   Pulmonary shortness of breath,           Cardiovascular hypertension, Pt. on medications      Neuro/Psych  Headaches, PSYCHIATRIC DISORDERS Depression Bipolar Disorder    GI/Hepatic GERD  Controlled,  Endo/Other    Renal/GU      Musculoskeletal   Abdominal   Peds  Hematology   Anesthesia Other Findings Obese. Severe overbite.   Reproductive/Obstetrics                            Anesthesia Physical Anesthesia Plan  ASA: III  Anesthesia Plan: General   Post-op Pain Management:    Induction: Intravenous  PONV Risk Score and Plan:   Airway Management Planned: LMA  Additional Equipment:   Intra-op Plan:   Post-operative Plan:   Informed Consent: I have reviewed the patients History and Physical, chart, labs and discussed the procedure including the risks, benefits and alternatives for the proposed anesthesia with the patient or authorized representative who has indicated his/her understanding and acceptance.     Plan Discussed with: CRNA  Anesthesia Plan Comments:         Anesthesia Quick Evaluation

## 2017-04-07 NOTE — Anesthesia Post-op Follow-up Note (Signed)
Anesthesia QCDR form completed.        

## 2017-04-07 NOTE — Transfer of Care (Signed)
Immediate Anesthesia Transfer of Care Note  Patient: Elizabeth Gay  Procedure(s) Performed: ARTHROSCOPY KNEE WITH LOOSE BODY REMOVAL (Left )  Patient Location: PACU  Anesthesia Type:General  Level of Consciousness: sedated  Airway & Oxygen Therapy: Patient Spontanous Breathing and Patient connected to face mask oxygen  Post-op Assessment: Report given to RN and Post -op Vital signs reviewed and stable  Post vital signs: Reviewed and stable  Last Vitals:  Vitals:   04/07/17 1344 04/07/17 1620  BP: (!) 149/97 (!) 145/88  Pulse: (!) 102 (!) 111  Resp: 17 12  Temp: (!) 36.4 C (!) 36.4 C  SpO2: 100% 100%    Last Pain:  Vitals:   04/07/17 1344  TempSrc: Oral         Complications: No apparent anesthesia complications

## 2017-04-07 NOTE — Discharge Instructions (Addendum)
AMBULATORY SURGERY  DISCHARGE INSTRUCTIONS   1) The drugs that you were given will stay in your system until tomorrow so for the next 24 hours you should not:  A) Drive an automobile B) Make any legal decisions C) Drink any alcoholic beverage   2) You may resume regular meals tomorrow.  Today it is better to start with liquids and gradually work up to solid foods.  You may eat anything you prefer, but it is better to start with liquids, then soup and crackers, and gradually work up to solid foods.   3) Please notify your doctor immediately if you have any unusual bleeding, trouble breathing, redness and pain at the surgery site, drainage, fever, or pain not relieved by medication. 4)   5) Your post-operative visit with Dr.                                     is: Date:                        Time:    Please call to schedule your post-operative visit.  6) Additional Instructions:          Keep dressing clean and dry until recheck Monday.  Pain medicine as directed.  Minimize activities through the weekend.  Aspirin 325 mg daily until rechecked

## 2017-04-07 NOTE — H&P (Signed)
Reviewed paper H+P, will be scanned into chart. No changes noted.  

## 2017-04-07 NOTE — Anesthesia Procedure Notes (Signed)
Procedure Name: LMA Insertion Date/Time: 04/07/2017 3:28 PM Performed by: Dionne Bucy, CRNA Pre-anesthesia Checklist: Patient identified, Patient being monitored, Timeout performed, Emergency Drugs available and Suction available Patient Re-evaluated:Patient Re-evaluated prior to induction Oxygen Delivery Method: Circle system utilized Preoxygenation: Pre-oxygenation with 100% oxygen Induction Type: IV induction Ventilation: Mask ventilation without difficulty LMA: LMA inserted LMA Size: 4.0 Tube type: Oral Number of attempts: 1 Placement Confirmation: positive ETCO2 and breath sounds checked- equal and bilateral Tube secured with: Tape Dental Injury: Teeth and Oropharynx as per pre-operative assessment

## 2017-04-07 NOTE — Op Note (Signed)
04/07/2017  4:10 PM  PATIENT:  Elizabeth Gay  57 y.o. female  PRE-OPERATIVE DIAGNOSIS:  LOOSE BODY OF LEFT KNEE  POST-OPERATIVE DIAGNOSIS:  LOOSE BODY OF LEFT KNEE  PROCEDURE:  Procedure(s): ARTHROSCOPY KNEE WITH LOOSE BODY REMOVAL (Left)  SURGEON: Laurene Footman, MD  ASSISTANTS: none  ANESTHESIA:   general  EBL:  Total I/O In: 800 [I.V.:800] Out: 5 [Blood:5]  BLOOD ADMINISTERED:none  DRAINS: none   LOCAL MEDICATIONS USED:  MARCAINE     SPECIMEN:  No Specimen  DISPOSITION OF SPECIMEN:  N/A  COUNTS:  YES  TOURNIQUET:  * Missing tourniquet times found for documented tourniquets in log: [id number] *  IMPLANTS: none  DICTATION: .Dragon Dictation patient was brought to the operating room and after adequate anesthesia was obtained the left leg was put placed arthroscopic leg holder with a tourniquet applied.  After prepping and draping in the usual sterile fashion appropriate patient identification and timeout procedures were completed.  Inferior lateral portal was made and initial inspection revealed significant patellofemoral degenerative change suprapatellar pouch was free of any loose bodies although there were multiple loose articular fragments floating in the knee.  The gutters were free of loose bodies.  Going to the medial compartment inferior medial portal was made and the meniscus was intact although there was extensive degenerative changes over the entire tibial and femoral condyles.  The ACL was intact and there were some loose bodies present within the notch which were removed with the use of a grasper the lateral compartment again showed extensive degenerative changes but the intact lateral meniscus after thoroughly irrigating out the knee and after having removed 2 small loose bodies all instrumentation was withdrawn and sterile dressings applied with closure of the skin with 4-0 nylon injecting 20 cc half percent Sensorcaine with epinephrine Xeroform 4 x 4's ABDs  web roll and Ace wrap applied  PLAN OF CARE: Discharge to home after PACU  PATIENT DISPOSITION:  PACU - hemodynamically stable.

## 2017-04-07 NOTE — Anesthesia Postprocedure Evaluation (Signed)
Anesthesia Post Note  Patient: Elizabeth Gay  Procedure(s) Performed: ARTHROSCOPY KNEE WITH LOOSE BODY REMOVAL (Left )  Patient location during evaluation: PACU Anesthesia Type: General Level of consciousness: awake and alert and oriented Pain management: pain level controlled Vital Signs Assessment: post-procedure vital signs reviewed and stable Respiratory status: spontaneous breathing Cardiovascular status: blood pressure returned to baseline Anesthetic complications: no     Last Vitals:  Vitals:   04/07/17 1735 04/07/17 1756  BP: (!) 146/79 (!) 154/87  Pulse: 98 95  Resp: 16 16  Temp: (!) 36.1 C   SpO2: 93% 94%    Last Pain:  Vitals:   04/07/17 1756  TempSrc:   PainSc: 4                  Reise Hietala

## 2017-04-08 ENCOUNTER — Encounter: Payer: Self-pay | Admitting: Orthopedic Surgery

## 2017-09-23 ENCOUNTER — Encounter: Payer: Self-pay | Admitting: Emergency Medicine

## 2017-09-23 ENCOUNTER — Emergency Department
Admission: EM | Admit: 2017-09-23 | Discharge: 2017-09-23 | Disposition: A | Discharge status: Home / Self Care | Payer: Medicare HMO | Attending: Emergency Medicine | Admitting: Emergency Medicine

## 2017-09-23 ENCOUNTER — Other Ambulatory Visit: Payer: Self-pay

## 2017-09-23 DIAGNOSIS — I10 Essential (primary) hypertension: Secondary | ICD-10-CM | POA: Insufficient documentation

## 2017-09-23 DIAGNOSIS — L03116 Cellulitis of left lower limb: Secondary | ICD-10-CM | POA: Diagnosis not present

## 2017-09-23 DIAGNOSIS — R2242 Localized swelling, mass and lump, left lower limb: Secondary | ICD-10-CM | POA: Diagnosis present

## 2017-09-23 MED ORDER — CEPHALEXIN 500 MG PO CAPS: 500.0000 mg | ORAL_CAPSULE | Freq: Three times a day (TID) | ORAL | 0 refills | Status: AC

## 2017-09-23 MED ORDER — CEPHALEXIN 500 MG PO CAPS
500.0000 mg | ORAL_CAPSULE | Freq: Once | ORAL | Status: AC
  Administered 2017-09-23: 500 mg via ORAL
  Filled 2017-09-23: qty 1

## 2017-09-23 NOTE — ED Provider Notes (Signed)
[facility] Emergency Department Provider Note  ____________________________________________  Time seen: Approximately 11:41 PM  I have reviewed the triage vital signs and the nursing notes.   HISTORY  Chief Complaint Insect Bite   HPI Taylormarie Register is a 57 y.o. female who presents to the emergency department for treatment and evaluation of a red swollen area on the back of her left leg. Yesterday, she felt 2 sharp pinches as if she had been bitten by something. She took off her pants but couldn't find anything. The area was a little red yesterday, but has gotten more red and much more swollen throughout the day. She tried to do an online visit but was told to come to the ER. No fever or nausea.    Past Medical History:  Diagnosis Date  . Bipolar disorder ([place])   . Depression   . Dyspnea   . GERD (gastroesophageal reflux disease)   . Headache   . History of kidney stones   . Hypertension   . Pre-diabetes     There are no active problems to display for this patient.   Past Surgical History:  Procedure Laterality Date  . ABDOMINAL HYSTERECTOMY     partial  . CESAREAN SECTION     x5  . KNEE ARTHROSCOPY Left 04/07/2017   Procedure: ARTHROSCOPY KNEE WITH LOOSE BODY REMOVAL;  Surgeon: Hessie Knows, MD;  Location: [facility] ORS;  Service: Orthopedics;  Laterality: Left;    Prior to Admission medications   Medication Sig Start Date End Date Taking? Authorizing Provider  cephALEXin (KEFLEX) 500 MG capsule Take 1 capsule (500 mg total) by mouth 3 (three) times daily for 10 days. 09/23/17 10/03/17  Lindell Tussey, Johnette Abraham B, FNP  docusate sodium (COLACE) 100 MG capsule Take 100 mg by mouth 2 (two) times daily as needed for mild constipation.     [provider]  HYDROcodone-acetaminophen (NORCO) 5-325 MG tablet Take 1 tablet by mouth every 6 (six) hours as needed for moderate pain. 04/07/17   Hessie Knows, MD    Allergies Patient has no known  allergies.  No family history on file.  Social History Social History   Tobacco Use  . Smoking status: Never Smoker  . Smokeless tobacco: Never Used  Substance Use Topics  . Alcohol use: No  . Drug use: No    Review of Systems  Constitutional: Negative for fever. Respiratory: Negative for cough or shortness of breath.  Musculoskeletal: Negative for myalgias Skin: Positive for redness, swelling, and tenderness over the left posterior calf. Neurological: Negative for numbness or paresthesias. ____________________________________________   PHYSICAL EXAM:  VITAL SIGNS: ED Triage Vitals  Enc Vitals Group     BP 09/23/17 2239 (!) 147/72     Pulse Rate 09/23/17 2239 87     Resp 09/23/17 2239 18     Temp 09/23/17 2239 98.3 F (36.8 C)     Temp Source 09/23/17 2239 Oral     SpO2 09/23/17 2239 100 %     Weight 09/23/17 2239 203 lb (92.1 kg)     Height 09/23/17 2239 5\' 1"  (1.549 m)     Head Circumference --      Peak Flow --      Pain Score 09/23/17 2253 7     Pain Loc --      Pain Edu? --      Excl. in [place]? --      Constitutional: Well appearing. Eyes: Conjunctivae are clear without discharge or drainage. Nose:  No rhinorrhea noted. Mouth/Throat: Airway is patent.  Neck: No stridor. Unrestricted range of motion observed. Cardiovascular: Capillary refill is <3 seconds.  Respiratory: Respirations are even and unlabored.. Musculoskeletal: Unrestricted range of motion observed. Neurologic: Awake, alert, and oriented x 4.  Skin: Erythematous indurated, warm area on the posterior calf of the left leg without fluctuance  ____________________________________________   LABS (all labs ordered are listed, but only abnormal results are displayed)  Labs Reviewed - No data to display ____________________________________________  EKG  Not indicated. ____________________________________________  RADIOLOGY  Not  indicated ____________________________________________   PROCEDURES  Procedures ____________________________________________   INITIAL IMPRESSION / ASSESSMENT AND PLAN / ED COURSE  Taaliyah Delpriore is a 57 y.o. female who presents to the emergency department for treatment and evaluation of a tender area on the back of her left calf presumably from 2 insect bites that occurred yesterday.  No lymphangitis was noted, however due to the size of the area and the degree of erythema she will be treated with antibiotic for presumed cellulitis secondary to insect bite.    Medications  cephALEXin (KEFLEX) capsule 500 mg (500 mg Oral Given 09/23/17 2342)     Pertinent labs & imaging results that were available during my care of the patient were reviewed by me and considered in my medical decision making (see chart for details).  ____________________________________________   FINAL CLINICAL IMPRESSION(S) / ED DIAGNOSES  Final diagnoses:  Cellulitis of left lower extremity    ED Discharge Orders         Ordered    cephALEXin (KEFLEX) 500 MG capsule  3 times daily     09/23/17 2311           Note:  This document was prepared using Dragon voice recognition software and may include unintentional dictation errors.    Victorino Dike, FNP 09/23/17 3729    Nance Pear, MD 09/24/17 0001

## 2017-09-23 NOTE — ED Notes (Signed)
See triage note. Pt reports possible insect bite to L calf yesterday. Ice applied to site with some reduction of redness however pt reports today redness is back and site is swelling. Calf is hot to touch.

## 2017-09-23 NOTE — Discharge Instructions (Signed)
Please follow up with your primary care provider if not improving over the weekend. Return to the ER for symptoms that change or worsen if unable to schedule an appointment.

## 2017-09-23 NOTE — ED Triage Notes (Signed)
Pt reports she had an insect bite yesterday to left lowe leg, redness to area pt reports redness is much better today, warm to touch.

## 2017-12-01 ENCOUNTER — Other Ambulatory Visit: Payer: Self-pay

## 2017-12-01 DIAGNOSIS — Z1211 Encounter for screening for malignant neoplasm of colon: Secondary | ICD-10-CM

## 2017-12-16 ENCOUNTER — Ambulatory Visit: Payer: Medicare HMO | Admitting: Anesthesiology

## 2017-12-16 ENCOUNTER — Encounter
Admission: RE | Disposition: A | Discharge status: Home / Self Care | Payer: Self-pay | Source: Ambulatory Visit | Attending: Gastroenterology

## 2017-12-16 ENCOUNTER — Ambulatory Visit
Admission: RE | Admit: 2017-12-16 | Discharge: 2017-12-16 | Disposition: A | Discharge status: Home / Self Care | Payer: Medicare HMO | Source: Ambulatory Visit | Attending: Gastroenterology | Admitting: Gastroenterology

## 2017-12-16 DIAGNOSIS — K5730 Diverticulosis of large intestine without perforation or abscess without bleeding: Secondary | ICD-10-CM | POA: Insufficient documentation

## 2017-12-16 DIAGNOSIS — Z1211 Encounter for screening for malignant neoplasm of colon: Secondary | ICD-10-CM | POA: Diagnosis not present

## 2017-12-16 DIAGNOSIS — D122 Benign neoplasm of ascending colon: Secondary | ICD-10-CM

## 2017-12-16 DIAGNOSIS — K635 Polyp of colon: Secondary | ICD-10-CM | POA: Diagnosis not present

## 2017-12-16 DIAGNOSIS — I10 Essential (primary) hypertension: Secondary | ICD-10-CM | POA: Diagnosis not present

## 2017-12-16 DIAGNOSIS — D120 Benign neoplasm of cecum: Secondary | ICD-10-CM | POA: Diagnosis not present

## 2017-12-16 DIAGNOSIS — K6389 Other specified diseases of intestine: Secondary | ICD-10-CM | POA: Insufficient documentation

## 2017-12-16 HISTORY — PX: COLONOSCOPY WITH PROPOFOL: SHX5780

## 2017-12-16 SURGERY — COLONOSCOPY WITH PROPOFOL
Anesthesia: General

## 2017-12-16 MED ORDER — PROPOFOL 500 MG/50ML IV EMUL
INTRAVENOUS | Status: DC | PRN
  Administered 2017-12-16: 120 ug/kg/min via INTRAVENOUS

## 2017-12-16 MED ORDER — PROPOFOL 10 MG/ML IV BOLUS
INTRAVENOUS | Status: DC | PRN
  Administered 2017-12-16: 90 mg via INTRAVENOUS

## 2017-12-16 MED ORDER — PROPOFOL 500 MG/50ML IV EMUL
INTRAVENOUS | Status: AC
  Filled 2017-12-16: qty 50

## 2017-12-16 MED ORDER — SODIUM CHLORIDE 0.9 % IV SOLN
INTRAVENOUS | Status: DC
  Administered 2017-12-16: 10:00:00 via INTRAVENOUS

## 2017-12-16 NOTE — Transfer of Care (Signed)
Immediate Anesthesia Transfer of Care Note  Patient: Elizabeth Gay  Procedure(s) Performed: COLONOSCOPY WITH PROPOFOL (N/A )  Patient Location: Endoscopy Unit  Anesthesia Type:General  Level of Consciousness: awake, alert  and oriented  Airway & Oxygen Therapy: Patient Spontanous Breathing and Patient connected to nasal cannula oxygen  Post-op Assessment: Report given to RN and Post -op Vital signs reviewed and stable  Post vital signs: Reviewed and stable  Last Vitals:  Vitals Value Taken Time  BP 115/72 12/16/2017 10:24 AM  Temp 36.1 C 12/16/2017 10:20 AM  Pulse 83 12/16/2017 10:33 AM  Resp 18 12/16/2017 10:33 AM  SpO2 100 % 12/16/2017 10:33 AM  Vitals shown include unvalidated device data.  Last Pain:  Vitals:   12/16/17 1020  TempSrc: Tympanic         Complications: No apparent anesthesia complications

## 2017-12-16 NOTE — Anesthesia Preprocedure Evaluation (Addendum)
Anesthesia Evaluation  Patient identified by MRN, date of birth, ID band Patient awake    Reviewed: Allergy & Precautions, NPO status , Patient's Chart, lab work & pertinent test results  History of Anesthesia Complications Negative for: history of anesthetic complications  Airway Mallampati: II       Dental  (+) Poor Dentition, Loose,    Pulmonary neg sleep apnea, neg COPD,           Cardiovascular hypertension (improved, no meds now), (-) Past MI and (-) CHF (-) dysrhythmias (-) Valvular Problems/Murmurs     Neuro/Psych neg Seizures Depression Bipolar Disorder    GI/Hepatic Neg liver ROS, GERD (not taking meds)  ,  Endo/Other  neg diabetes  Renal/GU negative Renal ROS     Musculoskeletal   Abdominal   Peds  Hematology   Anesthesia Other Findings   Reproductive/Obstetrics                             Anesthesia Physical Anesthesia Plan  ASA: II  Anesthesia Plan: General   Post-op Pain Management:    Induction:   PONV Risk Score and Plan: 3 and TIVA, Propofol infusion and Ondansetron  Airway Management Planned: Nasal Cannula  Additional Equipment:   Intra-op Plan:   Post-operative Plan:   Informed Consent: I have reviewed the patients History and Physical, chart, labs and discussed the procedure including the risks, benefits and alternatives for the proposed anesthesia with the patient or authorized representative who has indicated his/her understanding and acceptance.     Plan Discussed with:   Anesthesia Plan Comments:         Anesthesia Quick Evaluation

## 2017-12-16 NOTE — Op Note (Signed)
[facility] Gastroenterology Patient Name: Elizabeth Gay Procedure Date: 12/16/2017 9:44 AM MRN: [id number] Account #: [id number] Date of Birth: [birth year] Admit Type: Outpatient Age: 57 Room: [facility] ENDO ROOM 4 Gender: Female Note Status: Finalized Procedure:            Colonoscopy Indications:          Screening for colorectal malignant neoplasm Providers:            Jonathon Bellows MD, MD Referring MD:         [facility]Barton Dubois (Referring MD) Medicines:            Monitored Anesthesia Care Complications:        No immediate complications. Procedure:            Pre-Anesthesia Assessment:                       - Prior to the procedure, a History and Physical was                        performed, and patient medications, allergies and                        sensitivities were reviewed. The patient's tolerance of                        previous anesthesia was reviewed.                       - The risks and benefits of the procedure and the                        sedation options and risks were discussed with the                        patient. All questions were answered and informed                        consent was obtained.                       - ASA Grade Assessment: II - A patient with mild                        systemic disease.                       After obtaining informed consent, the colonoscope was                        passed under direct vision. Throughout the procedure,                        the patient's blood pressure, pulse, and oxygen                        saturations were monitored continuously. The                        Colonoscope was introduced through the anus and  advanced to the the cecum, identified by the                        appendiceal orifice, IC valve and transillumination.                        The colonoscopy was performed with ease. The patient                        tolerated the procedure  well. The quality of the bowel                        preparation was good. Findings:      The perianal and digital rectal examinations were normal.      Two sessile polyps were found in the ascending colon and cecum. The       polyps were 3 to 4 mm in size. These polyps were removed with a cold       biopsy forceps. Resection and retrieval were complete.      Multiple small-mouthed diverticula were found in the sigmoid colon.      The exam was otherwise without abnormality on direct and retroflexion       views. Impression:           - Two 3 to 4 mm polyps in the ascending colon and in                        the cecum, removed with a cold biopsy forceps. Resected                        and retrieved.                       - Diverticulosis in the sigmoid colon.                       - The examination was otherwise normal on direct and                        retroflexion views. Recommendation:       - Discharge patient to home (with escort).                       - Resume previous diet.                       - Continue present medications.                       - Await pathology results.                       - Repeat colonoscopy in 5-10 years for surveillance                        based on pathology results. Procedure Code(s):    --- Professional ---                       [phone], Colonoscopy, flexible; with biopsy, single or  multiple Diagnosis Code(s):    --- Professional ---                       Z12.11, Encounter for screening for malignant neoplasm                        of colon                       D12.2, Benign neoplasm of ascending colon                       D12.0, Benign neoplasm of cecum                       K57.30, Diverticulosis of large intestine without                        perforation or abscess without bleeding CPT copyright 2018 American Medical Association. All rights reserved. The codes documented in this report are preliminary and upon  coder review may  be revised to meet current compliance requirements. Jonathon Bellows, MD Jonathon Bellows MD, MD 12/16/2017 10:22:49 AM This report has been signed electronically. Number of Addenda: 0 Note Initiated On: 12/16/2017 9:44 AM Scope Withdrawal Time: 0 hours 15 minutes 55 seconds       [facility]

## 2017-12-16 NOTE — Anesthesia Postprocedure Evaluation (Signed)
Anesthesia Post Note  Patient: Elizabeth Gay  Procedure(s) Performed: COLONOSCOPY WITH PROPOFOL (N/A )  Patient location during evaluation: Endoscopy Anesthesia Type: General Level of consciousness: awake and alert Pain management: pain level controlled Vital Signs Assessment: post-procedure vital signs reviewed and stable Respiratory status: spontaneous breathing and respiratory function stable Cardiovascular status: stable Anesthetic complications: no     Last Vitals:  Vitals:   12/16/17 1040 12/16/17 1050  BP: 126/74   Pulse: 83 (!) 119  Resp: 17 17  Temp:    SpO2: 97% (!) 86%    Last Pain:  Vitals:   12/16/17 1020  TempSrc: Tympanic                 KEPHART,WILLIAM K

## 2017-12-16 NOTE — Anesthesia Post-op Follow-up Note (Signed)
Anesthesia QCDR form completed.        

## 2017-12-16 NOTE — H&P (Signed)
Jonathon Bellows, MD [address] [place], [place], [place], [address] [address], [place], [place], [place], [address] Phone: [phone]  Fax: [phone]  Primary Care Physician:  Freddy Finner, NP   Pre-Procedure History & Physical: HPI:  Elizabeth Gay is a 57 y.o. female is here for an colonoscopy.   Past Medical History:  Diagnosis Date  . Bipolar disorder ([place])   . Depression   . Dyspnea   . GERD (gastroesophageal reflux disease)   . Headache   . History of kidney stones   . Hypertension   . Pre-diabetes     Past Surgical History:  Procedure Laterality Date  . ABDOMINAL HYSTERECTOMY     partial  . CESAREAN SECTION     x5  . KNEE ARTHROSCOPY Left 04/07/2017   Procedure: ARTHROSCOPY KNEE WITH LOOSE BODY REMOVAL;  Surgeon: Hessie Knows, MD;  Location: [facility] ORS;  Service: Orthopedics;  Laterality: Left;    Prior to Admission medications   Medication Sig Start Date End Date Taking? Authorizing Provider  docusate sodium (COLACE) 100 MG capsule Take 100 mg by mouth 2 (two) times daily as needed for mild constipation.     [provider]  HYDROcodone-acetaminophen (NORCO) 5-325 MG tablet Take 1 tablet by mouth every 6 (six) hours as needed for moderate pain. Patient not taking: Reported on 12/16/2017 04/07/17   Hessie Knows, MD    Allergies as of 12/01/2017  . (No Known Allergies)    No family history on file.  Social History   Socioeconomic History  . Marital status: Single    Spouse name: Not on file  . Number of children: Not on file  . Years of education: Not on file  . Highest education level: Not on file  Occupational History  . Not on file  Social Needs  . Financial resource strain: Not on file  . Food insecurity:    Worry: Not on file    Inability: Not on file  . Transportation needs:    Medical: Not on file    Non-medical: Not on file  Tobacco Use  . Smoking status: Never Smoker  . Smokeless tobacco: Never Used    Substance and Sexual Activity  . Alcohol use: No  . Drug use: No  . Sexual activity: Not on file  Lifestyle  . Physical activity:    Days per week: Not on file    Minutes per session: Not on file  . Stress: Not on file  Relationships  . Social connections:    Talks on phone: Not on file    Gets together: Not on file    Attends religious service: Not on file    Active member of club or organization: Not on file    Attends meetings of clubs or organizations: Not on file    Relationship status: Not on file  . Intimate partner violence:    Fear of current or ex partner: Not on file    Emotionally abused: Not on file    Physically abused: Not on file    Forced sexual activity: Not on file  Other Topics Concern  . Not on file  Social History Narrative  . Not on file    Review of Systems: See HPI, otherwise negative ROS  Physical Exam: BP (!) 157/93   Pulse 88   Temp (!) 97 F (36.1 C) (Tympanic)   Resp 16   Wt 94.3 kg   SpO2 100%   BMI 39.30  kg/m  General:   Alert,  pleasant and cooperative in NAD Head:  Normocephalic and atraumatic. Neck:  Supple; no masses or thyromegaly. Lungs:  Clear throughout to auscultation, normal respiratory effort.    Heart:  +S1, +S2, Regular rate and rhythm, No edema. Abdomen:  Soft, nontender and nondistended. Normal bowel sounds, without guarding, and without rebound.   Neurologic:  Alert and  oriented x4;  grossly normal neurologically.  Impression/Plan: Elizabeth Gay is here for an colonoscopy to be performed for Screening colonoscopy average risk   Risks, benefits, limitations, and alternatives regarding  colonoscopy have been reviewed with the patient.  Questions have been answered.  All parties agreeable.   Jonathon Bellows, MD  12/16/2017, 9:41 AM

## 2017-12-19 ENCOUNTER — Encounter: Payer: Self-pay | Admitting: Gastroenterology

## 2017-12-20 LAB — SURGICAL PATHOLOGY

## 2018-01-01 ENCOUNTER — Encounter: Payer: Self-pay | Admitting: Gastroenterology

## 2018-03-01 ENCOUNTER — Emergency Department
Admission: EM | Admit: 2018-03-01 | Discharge: 2018-03-01 | Disposition: A | Discharge status: Home / Self Care | Payer: Medicare HMO | Attending: Emergency Medicine | Admitting: Emergency Medicine

## 2018-03-01 ENCOUNTER — Encounter: Payer: Self-pay | Admitting: Emergency Medicine

## 2018-03-01 ENCOUNTER — Other Ambulatory Visit: Payer: Self-pay

## 2018-03-01 DIAGNOSIS — I10 Essential (primary) hypertension: Secondary | ICD-10-CM | POA: Insufficient documentation

## 2018-03-01 DIAGNOSIS — R42 Dizziness and giddiness: Secondary | ICD-10-CM | POA: Diagnosis present

## 2018-03-01 DIAGNOSIS — H81399 Other peripheral vertigo, unspecified ear: Secondary | ICD-10-CM | POA: Diagnosis not present

## 2018-03-01 LAB — BASIC METABOLIC PANEL
ANION GAP: 7 (ref 5–15)
BUN: 12 mg/dL (ref 6–20)
CALCIUM: 8.7 mg/dL — AB (ref 8.9–10.3)
CHLORIDE: 109 mmol/L (ref 98–111)
CO2: 23 mmol/L (ref 22–32)
Creatinine, Ser: 0.67 mg/dL (ref 0.44–1.00)
GFR calc non Af Amer: >60 mL/min (ref >60)
Glucose, Bld: 109 mg/dL — ABNORMAL HIGH (ref 70–99)
POTASSIUM: 3.4 mmol/L — AB (ref 3.5–5.1)
Sodium: 139 mmol/L (ref 135–145)

## 2018-03-01 LAB — CBC
HEMATOCRIT: 37.3 % (ref 36.0–46.0)
Hemoglobin: 11.5 g/dL — ABNORMAL LOW (ref 12.0–15.0)
MCH: 23.2 pg — ABNORMAL LOW (ref 26.0–34.0)
MCHC: 30.8 g/dL (ref 30.0–36.0)
MCV: 75.2 fL — AB (ref 80.0–100.0)
NRBC: 0 % (ref 0.0–0.2)
PLATELETS: 363 10*3/uL (ref 150–400)
RBC: 4.96 MIL/uL (ref 3.87–5.11)
RDW: 18.3 % — ABNORMAL HIGH (ref 11.5–15.5)
WBC: 7.7 10*3/uL (ref 4.0–10.5)

## 2018-03-01 MED ORDER — MECLIZINE HCL 25 MG PO TABS: 25.0000 mg | ORAL_TABLET | Freq: Three times a day (TID) | ORAL | 1 refills | Status: AC | PRN

## 2018-03-01 MED ORDER — MECLIZINE HCL 25 MG PO TABS
25.0000 mg | ORAL_TABLET | Freq: Once | ORAL | Status: AC
  Administered 2018-03-01: 25 mg via ORAL
  Filled 2018-03-01: qty 1

## 2018-03-01 MED ORDER — SODIUM CHLORIDE 0.9% FLUSH: 3.0000 mL | Freq: Once | INTRAVENOUS | Status: DC

## 2018-03-01 NOTE — ED Triage Notes (Signed)
Pt arrived with complaints of dizziness. Pt states she woke up and the room was spinning and then pt had 1 episode of emesis. Pt reports it to be similar to her previous episodes of vertigo. Pt denies pain

## 2018-03-01 NOTE — ED Provider Notes (Signed)
[facility] Emergency Department Provider Note  ____________________________________________  Time seen: Approximately 2:28 PM  I have reviewed the triage vital signs and the nursing notes.   HISTORY  Chief Complaint Dizziness    HPI Elizabeth Gay is a 58 y.o. female with a history of bipolar disorder, GERD, hypertension who comes the ED complaining of dizziness.  She was in her usual state of health when she woke up this morning.  On sitting up and turning over to get out of bed she suddenly felt the room spinning.  This feels like vertigo that she has had in the past.  Denies any headache vision change weakness or paresthesias.  No recent trauma.  Somewhat resolved after sitting still in the ED treatment bed.  Symptoms have been constant, worse with movement and head turning, no alleviating factors.      Past Medical History:  Diagnosis Date  . Bipolar disorder ([place])   . Depression   . Dyspnea   . GERD (gastroesophageal reflux disease)   . Headache   . History of kidney stones   . Hypertension   . Pre-diabetes      There are no active problems to display for this patient.    Past Surgical History:  Procedure Laterality Date  . ABDOMINAL HYSTERECTOMY     partial  . CESAREAN SECTION     x5  . COLONOSCOPY WITH PROPOFOL N/A 12/16/2017   Procedure: COLONOSCOPY WITH PROPOFOL;  Surgeon: Jonathon Bellows, MD;  Location: [facility] ENDOSCOPY;  Service: Gastroenterology;  Laterality: N/A;  . KNEE ARTHROSCOPY Left 04/07/2017   Procedure: ARTHROSCOPY KNEE WITH LOOSE BODY REMOVAL;  Surgeon: Hessie Knows, MD;  Location: [facility] ORS;  Service: Orthopedics;  Laterality: Left;     Prior to Admission medications   Medication Sig Start Date End Date Taking? Authorizing Provider  docusate sodium (COLACE) 100 MG capsule Take 100 mg by mouth 2 (two) times daily as needed for mild constipation.     [provider]  HYDROcodone-acetaminophen (NORCO) 5-325 MG  tablet Take 1 tablet by mouth every 6 (six) hours as needed for moderate pain. Patient not taking: Reported on 12/16/2017 04/07/17   Hessie Knows, MD  meclizine (ANTIVERT) 25 MG tablet Take 1 tablet (25 mg total) by mouth 3 (three) times daily as needed for dizziness or nausea. 03/01/18   Carrie Mew, MD     Allergies Patient has no known allergies.   No family history on file.  Social History Social History   Tobacco Use  . Smoking status: Never Smoker  . Smokeless tobacco: Never Used  Substance Use Topics  . Alcohol use: No  . Drug use: No    Review of Systems  Constitutional:   No fever or chills.  ENT:   No sore throat. No rhinorrhea. Cardiovascular:   No chest pain or syncope. Respiratory:   No dyspnea or cough. Gastrointestinal:   Negative for abdominal pain, vomiting and diarrhea.  Musculoskeletal:   Negative for focal pain or swelling All other systems reviewed and are negative except as documented above in ROS and HPI.  ____________________________________________   PHYSICAL EXAM:  VITAL SIGNS: ED Triage Vitals [03/01/18 0853]  Enc Vitals Group     BP (!) 180/90     Pulse Rate 89     Resp 18     Temp 97.8 F (36.6 C)     Temp Source Oral     SpO2 96 %     Weight 205 lb (93  kg)     Height 5\' 1"  (1.549 m)     Head Circumference      Peak Flow      Pain Score 0     Pain Loc      Pain Edu?      Excl. in [place]?     Vital signs reviewed, nursing assessments reviewed.   Constitutional:   Alert and oriented. Non-toxic appearance. Eyes:   Conjunctivae are normal. EOMI. PERRL. ENT      Head:   Normocephalic and atraumatic.      Nose:   No congestion/rhinnorhea.       Mouth/Throat:   MMM, no pharyngeal erythema. No peritonsillar mass.       Neck:   No meningismus. Full ROM. Hematological/Lymphatic/Immunilogical:   No cervical lymphadenopathy. Cardiovascular:   RRR. Symmetric bilateral radial and DP pulses.  No murmurs. Cap refill less than 2  seconds. Respiratory:   Normal respiratory effort without tachypnea/retractions. Breath sounds are clear and equal bilaterally. No wheezes/rales/rhonchi. Gastrointestinal:   Soft and nontender. Non distended. There is no CVA tenderness.  No rebound, rigidity, or guarding. Musculoskeletal:   Normal range of motion in all extremities. No joint effusions.  No lower extremity tenderness.  No edema. Neurologic:   Normal speech and language.  CN 3-12 intact HINTS normal. Motor grossly intact. No acute focal neurologic deficits are appreciated.  Skin:    Skin is warm, dry and intact. No rash noted.  No petechiae, purpura, or bullae.  ____________________________________________    LABS (pertinent positives/negatives) (all labs ordered are listed, but only abnormal results are displayed) Labs Reviewed  BASIC METABOLIC PANEL - Abnormal; Notable for the following components:      Result Value   Potassium 3.4 (*)    Glucose, Bld 109 (*)    Calcium 8.7 (*)    All other components within normal limits  CBC - Abnormal; Notable for the following components:   Hemoglobin 11.5 (*)    MCV 75.2 (*)    MCH 23.2 (*)    RDW 18.3 (*)    All other components within normal limits  URINALYSIS, COMPLETE (UACMP) WITH MICROSCOPIC  CBG MONITORING, ED   ____________________________________________   EKG  Interpreted by me Sinus rhythm rate of 73, normal axis intervals.  Normal QRS ST segments and T waves.  No ischemic changes.  ____________________________________________    RADIOLOGY  No results found.  ____________________________________________   PROCEDURES Procedures  ____________________________________________    CLINICAL IMPRESSION / ASSESSMENT AND PLAN / ED COURSE  Pertinent labs & imaging results that were available during my care of the patient were reviewed by me and considered in my medical decision making (see chart for details).    Patient presents with dizziness,  consistent with peripheral vertigo clinically.  Considering the patient's symptoms, medical history, and physical examination today, I have low suspicion for ischemic stroke, intracranial hemorrhage, meningitis, encephalitis, carotid or vertebral dissection, venous sinus thrombosis, MS, intracranial hypertension, glaucoma, CRAO, CRVO, or temporal arteritis. Vital signs unremarkable.  After meclizine patient feels much better.  Will continue meclizine and discharged home to follow-up with primary care.      ____________________________________________   FINAL CLINICAL IMPRESSION(S) / ED DIAGNOSES    Final diagnoses:  Peripheral vertigo, unspecified laterality     ED Discharge Orders         Ordered    meclizine (ANTIVERT) 25 MG tablet  3 times daily PRN     03/01/18 1428  Portions of this note were generated with dragon dictation software. Dictation errors may occur despite best attempts at proofreading.   Carrie Mew, MD 03/01/18 1431

## 2018-03-01 NOTE — ED Notes (Addendum)
First Nurse Note: Patient states she woke up at 0500 this AM with "vertigo" and that she vomited X1.  Came to ED on bus.  Alert and oriented, denies need for WC when asked.  States sx have improved but she is still dizzy.  Went to bed at 2100. No arm or leg drift noted, alert and oriented X 4, grip strength equal.  Placed in WC.

## 2018-03-01 NOTE — ED Notes (Signed)
NAD noted at this time. Pt resting in bed with NAD noted at this time.

## 2019-06-22 ENCOUNTER — Emergency Department
Admission: EM | Admit: 2019-06-22 | Discharge: 2019-06-22 | Disposition: A | Discharge status: Home / Self Care | Payer: Medicare HMO | Attending: Emergency Medicine | Admitting: Emergency Medicine

## 2019-06-22 ENCOUNTER — Emergency Department: Payer: Medicare HMO

## 2019-06-22 ENCOUNTER — Encounter: Payer: Self-pay | Admitting: Emergency Medicine

## 2019-06-22 DIAGNOSIS — I10 Essential (primary) hypertension: Secondary | ICD-10-CM | POA: Diagnosis not present

## 2019-06-22 DIAGNOSIS — R109 Unspecified abdominal pain: Secondary | ICD-10-CM | POA: Diagnosis not present

## 2019-06-22 DIAGNOSIS — R1011 Right upper quadrant pain: Secondary | ICD-10-CM | POA: Diagnosis present

## 2019-06-22 DIAGNOSIS — Z79899 Other long term (current) drug therapy: Secondary | ICD-10-CM | POA: Diagnosis not present

## 2019-06-22 LAB — COMPREHENSIVE METABOLIC PANEL
ALT: 15 U/L (ref 0–44)
AST: 16 U/L (ref 15–41)
Albumin: 3.9 g/dL (ref 3.5–5.0)
Alkaline Phosphatase: 109 U/L (ref 38–126)
Anion gap: 9 (ref 5–15)
BUN: 10 mg/dL (ref 6–20)
CO2: 25 mmol/L (ref 22–32)
Calcium: 8.7 mg/dL — ABNORMAL LOW (ref 8.9–10.3)
Chloride: 108 mmol/L (ref 98–111)
Creatinine, Ser: 0.74 mg/dL (ref 0.44–1.00)
GFR calc Af Amer: >60 mL/min (ref >60)
GFR calc non Af Amer: >60 mL/min (ref >60)
Glucose, Bld: 83 mg/dL (ref 70–99)
Potassium: 3.6 mmol/L (ref 3.5–5.1)
Sodium: 142 mmol/L (ref 135–145)
Total Bilirubin: 0.7 mg/dL (ref 0.3–1.2)
Total Protein: 7.9 g/dL (ref 6.5–8.1)

## 2019-06-22 LAB — CBC WITH DIFFERENTIAL/PLATELET
Abs Immature Granulocytes: 0.04 10*3/uL (ref 0.00–0.07)
Basophils Absolute: 0 10*3/uL (ref 0.0–0.1)
Basophils Relative: 0 %
Eosinophils Absolute: 0.2 10*3/uL (ref 0.0–0.5)
Eosinophils Relative: 3 %
HCT: 35.9 % — ABNORMAL LOW (ref 36.0–46.0)
Hemoglobin: 11.6 g/dL — ABNORMAL LOW (ref 12.0–15.0)
Immature Granulocytes: 0 %
Lymphocytes Relative: 22 %
Lymphs Abs: 2.1 10*3/uL (ref 0.7–4.0)
MCH: 24.1 pg — ABNORMAL LOW (ref 26.0–34.0)
MCHC: 32.3 g/dL (ref 30.0–36.0)
MCV: 74.5 fL — ABNORMAL LOW (ref 80.0–100.0)
Monocytes Absolute: 0.5 10*3/uL (ref 0.1–1.0)
Monocytes Relative: 5 %
Neutro Abs: 6.6 10*3/uL (ref 1.7–7.7)
Neutrophils Relative %: 70 %
Platelets: 296 10*3/uL (ref 150–400)
RBC: 4.82 MIL/uL (ref 3.87–5.11)
RDW: 17.6 % — ABNORMAL HIGH (ref 11.5–15.5)
WBC: 9.6 10*3/uL (ref 4.0–10.5)
nRBC: 0 % (ref 0.0–0.2)

## 2019-06-22 LAB — URINALYSIS, COMPLETE (UACMP) WITH MICROSCOPIC
Bacteria, UA: NONE SEEN
Bilirubin Urine: NEGATIVE
Glucose, UA: NEGATIVE mg/dL
Hgb urine dipstick: NEGATIVE
Ketones, ur: NEGATIVE mg/dL
Leukocytes,Ua: NEGATIVE
Nitrite: NEGATIVE
Protein, ur: 30 mg/dL — AB
Specific Gravity, Urine: 1.012 (ref 1.005–1.030)
pH: 7 (ref 5.0–8.0)

## 2019-06-22 LAB — TROPONIN I (HIGH SENSITIVITY): Troponin I (High Sensitivity): 4 ng/L (ref <18)

## 2019-06-22 LAB — LIPASE, BLOOD: Lipase: 18 U/L (ref 11–51)

## 2019-06-22 MED ORDER — OXYCODONE-ACETAMINOPHEN 5-325 MG PO TABS
1.0000 | ORAL_TABLET | Freq: Once | ORAL | Status: AC
  Administered 2019-06-22: 1 via ORAL
  Filled 2019-06-22: qty 1

## 2019-06-22 MED ORDER — ONDANSETRON 4 MG PO TBDP
4.0000 mg | ORAL_TABLET | Freq: Once | ORAL | Status: AC
  Administered 2019-06-22: 4 mg via ORAL
  Filled 2019-06-22: qty 1

## 2019-06-22 NOTE — ED Notes (Signed)
Pt transported to CT. Pt brought back without exam being done. Pt stating she cannot go through with it and wants to leave AMA. PA at bedside to advise pt to stay. Pt stating she still wants to leave AMA.

## 2019-06-22 NOTE — ED Provider Notes (Signed)
Emergency Department Provider Note  ____________________________________________  Time seen: Approximately 6:43 PM  I have reviewed the triage vital signs and the nursing notes.   HISTORY  Chief Complaint No chief complaint on file.   Historian Patient     HPI Elizabeth Gay is a 59 y.o. female presents to the emergency department with right-sided flank pain that has occurred intermittently over the past 3 weeks.  Patient states that the pain worsen acutely today.  Pain is worsened with deep inspiration.  Patient states that pain seems to radiate to the right upper quadrant of her abdomen.  Patient states that she has a history of gallstones and is under the impression that she has had a prior cholecystectomy but she is not completely sure.  She denies chest pain or chest tightness.  No nausea or vomiting.  She denies dysuria, hematuria or increased urinary frequency.  No falls or mechanisms of trauma.  Patient denies recent travel, prolonged immobilization or recent surgery.  Patient has never taken supplemental hormones and is not a daily smoker.  No other alleviating measures have been attempted.   Past Medical History:  Diagnosis Date  . Bipolar disorder ([place])   . Depression   . Dyspnea   . GERD (gastroesophageal reflux disease)   . Headache   . History of kidney stones   . Hypertension   . Pre-diabetes      Immunizations up to date:  Yes.     Past Medical History:  Diagnosis Date  . Bipolar disorder ([place])   . Depression   . Dyspnea   . GERD (gastroesophageal reflux disease)   . Headache   . History of kidney stones   . Hypertension   . Pre-diabetes     There are no problems to display for this patient.   Past Surgical History:  Procedure Laterality Date  . ABDOMINAL HYSTERECTOMY     partial  . CESAREAN SECTION     x5  . COLONOSCOPY WITH PROPOFOL N/A 12/16/2017   Procedure: COLONOSCOPY WITH PROPOFOL;  Surgeon: Jonathon Bellows, MD;  Location: [facility]  ENDOSCOPY;  Service: Gastroenterology;  Laterality: N/A;  . KNEE ARTHROSCOPY Left 04/07/2017   Procedure: ARTHROSCOPY KNEE WITH LOOSE BODY REMOVAL;  Surgeon: Hessie Knows, MD;  Location: [facility] ORS;  Service: Orthopedics;  Laterality: Left;    Prior to Admission medications   Medication Sig Start Date End Date Taking? Authorizing Provider  docusate sodium (COLACE) 100 MG capsule Take 100 mg by mouth 2 (two) times daily as needed for mild constipation.     [provider]  HYDROcodone-acetaminophen (NORCO) 5-325 MG tablet Take 1 tablet by mouth every 6 (six) hours as needed for moderate pain. Patient not taking: Reported on 12/16/2017 04/07/17   Hessie Knows, MD  meclizine (ANTIVERT) 25 MG tablet Take 1 tablet (25 mg total) by mouth 3 (three) times daily as needed for dizziness or nausea. 03/01/18   Carrie Mew, MD    Allergies Patient has no known allergies.  History reviewed. No pertinent family history.  Social History Social History   Tobacco Use  . Smoking status: Never Smoker  . Smokeless tobacco: Never Used  Substance Use Topics  . Alcohol use: No  . Drug use: No     Review of Systems  Constitutional: No fever/chills Eyes:  No discharge ENT: No upper respiratory complaints. Respiratory: no cough. No SOB/ use of accessory muscles to breath Gastrointestinal: Patient has RUQ abdominal discomfort and flank pain.  Musculoskeletal: Negative for musculoskeletal  pain. Skin: Negative for rash, abrasions, lacerations, ecchymosis.    ____________________________________________   PHYSICAL EXAM:  VITAL SIGNS: ED Triage Vitals  Enc Vitals Group     BP 06/22/19 1517 137/64     Pulse Rate 06/22/19 1517 92     Resp 06/22/19 1517 16     Temp 06/22/19 1517 (!) 100.8 F (38.2 C)     Temp Source 06/22/19 1517 Oral     SpO2 06/22/19 1517 97 %     Weight 06/22/19 1517 195 lb (88.5 kg)     Height 06/22/19 1517 5\' 1"  (1.549 m)     Head Circumference --      Peak  Flow --      Pain Score 06/22/19 1524 8     Pain Loc --      Pain Edu? --      Excl. in [place]? --      Constitutional: Alert and oriented. Well appearing and in no acute distress. Eyes: Conjunctivae are normal. PERRL. EOMI. Head: Atraumatic. Cardiovascular: Normal rate, regular rhythm. Normal S1 and S2.  Good peripheral circulation. Respiratory: Normal respiratory effort without tachypnea or retractions. Lungs CTAB. Good air entry to the bases with no decreased or absent breath sounds Gastrointestinal: Bowel sounds x 4 quadrants. Soft and nontender to palpation. No guarding or rigidity.  Patient has CVA tenderness on the right. Musculoskeletal: Full range of motion to all extremities. No obvious deformities noted Neurologic:  Normal for age. No gross focal neurologic deficits are appreciated.  Skin:  Skin is warm, dry and intact. No rash noted. Psychiatric: Mood and affect are normal for age. Speech and behavior are normal.   ____________________________________________   LABS (all labs ordered are listed, but only abnormal results are displayed)  Labs Reviewed  CBC WITH DIFFERENTIAL/PLATELET - Abnormal; Notable for the following components:      Result Value   Hemoglobin 11.6 (*)    HCT 35.9 (*)    MCV 74.5 (*)    MCH 24.1 (*)    RDW 17.6 (*)    All other components within normal limits  COMPREHENSIVE METABOLIC PANEL - Abnormal; Notable for the following components:   Calcium 8.7 (*)    All other components within normal limits  URINALYSIS, COMPLETE (UACMP) WITH MICROSCOPIC - Abnormal; Notable for the following components:   Color, Urine YELLOW (*)    APPearance CLEAR (*)    Protein, ur 30 (*)    All other components within normal limits  LIPASE, BLOOD  TROPONIN I (HIGH SENSITIVITY)   ____________________________________________  EKG   ____________________________________________  RADIOLOGY Unk Pinto, personally viewed and evaluated these images (plain  radiographs) as part of my medical decision making, as well as reviewing the written report by the radiologist.  DG Chest 2 View  Result Date: 06/22/2019 CLINICAL DATA:  Fever EXAM: CHEST - 2 VIEW COMPARISON:  01/25/2014 FINDINGS: The heart size and mediastinal contours are within normal limits. Both lungs are clear. Degenerative changes of the spine. IMPRESSION: No active cardiopulmonary disease. Electronically Signed   By: Donavan Foil M.D.   On: 06/22/2019 17:54    ____________________________________________    PROCEDURES  Procedure(s) performed:     Procedures     Medications  oxyCODONE-acetaminophen (PERCOCET/ROXICET) 5-325 MG per tablet 1 tablet (1 tablet Oral Given 06/22/19 1847)  ondansetron (ZOFRAN-ODT) disintegrating tablet 4 mg (4 mg Oral Given 06/22/19 1847)     ____________________________________________   INITIAL IMPRESSION / ASSESSMENT AND PLAN / ED COURSE  Pertinent labs & imaging results that were available during my care of the patient were reviewed by me and considered in my medical decision making (see chart for details).    Assessment and plan  Flank pain:  59 year old female presents to the emergency department with right-sided flank pain that radiates to her abdomen that has occurred for the past several weeks but  acutely worsened today.  Patient had low-grade fever at triage.  Vital signs were otherwise reassuring.  She had CVA tenderness on exam but no right upper quadrant tenderness to palpation.  Differential diagnosis includes intra-abdominal abscess, pyelonephritis, cystitis, PE...  Urinalysis does not indicate findings concerning for cystitis or pyelonephritis.  No hematuria to suggest nephrolithiasis.  No leukocytosis on CBC.  CMP was reassuring.  Lipase was within reference range.  Troponin was within reference range.  I had ordered CTs of the abdomen and pelvis as well as the chest to rule out intra-abdominal abscess and PE.  Patient states  that her anxiety kept her from having CT scans conducted.  She requested to leave the emergency department AMA, stating that she would come back to the emergency department when she was ready.  I cautioned patient that leaving the emergency department AMA could have severe consequence, including death.  She voiced understanding and stated that she would return when ready.  ____________________________________________  FINAL CLINICAL IMPRESSION(S) / ED DIAGNOSES  Final diagnoses:  Flank pain      NEW MEDICATIONS STARTED DURING THIS VISIT:  ED Discharge Orders    None          This chart was dictated using voice recognition software/Dragon. Despite best efforts to proofread, errors can occur which can change the meaning. Any change was purely unintentional.     Lannie Fields, PA-C 06/22/19 1953    Carrie Mew, MD 06/22/19 [phone]

## 2019-06-22 NOTE — ED Triage Notes (Signed)
First nurse note- pain to right flank area/right side.  Ambulatory, NAD

## 2019-06-22 NOTE — ED Triage Notes (Addendum)
Pt to ED with c/o of right side rib pain that has been ongoing for several weeks. Pt states pain increased overnight. Pt denies SOB and NAD noted.

## 2019-07-02 ENCOUNTER — Other Ambulatory Visit: Payer: Self-pay | Admitting: Primary Care

## 2019-07-02 DIAGNOSIS — Z1231 Encounter for screening mammogram for malignant neoplasm of breast: Secondary | ICD-10-CM

## 2019-07-23 ENCOUNTER — Encounter: Payer: Self-pay | Admitting: Emergency Medicine

## 2019-07-23 ENCOUNTER — Emergency Department: Payer: Medicare HMO

## 2019-07-23 ENCOUNTER — Other Ambulatory Visit: Payer: Self-pay

## 2019-07-23 ENCOUNTER — Emergency Department
Admission: EM | Admit: 2019-07-23 | Discharge: 2019-07-23 | Disposition: A | Discharge status: Home / Self Care | Payer: Medicare HMO | Attending: Student in an Organized Health Care Education/Training Program | Admitting: Student in an Organized Health Care Education/Training Program

## 2019-07-23 DIAGNOSIS — N3091 Cystitis, unspecified with hematuria: Secondary | ICD-10-CM | POA: Insufficient documentation

## 2019-07-23 DIAGNOSIS — N3001 Acute cystitis with hematuria: Secondary | ICD-10-CM

## 2019-07-23 DIAGNOSIS — R300 Dysuria: Secondary | ICD-10-CM | POA: Diagnosis present

## 2019-07-23 DIAGNOSIS — I10 Essential (primary) hypertension: Secondary | ICD-10-CM | POA: Insufficient documentation

## 2019-07-23 LAB — URINALYSIS, COMPLETE (UACMP) WITH MICROSCOPIC
Bilirubin Urine: NEGATIVE
Glucose, UA: NEGATIVE mg/dL
Ketones, ur: NEGATIVE mg/dL
Nitrite: NEGATIVE
Protein, ur: 30 mg/dL — AB
Specific Gravity, Urine: 1.009 (ref 1.005–1.030)
pH: 7 (ref 5.0–8.0)

## 2019-07-23 MED ORDER — CEPHALEXIN 500 MG PO CAPS: 500.0000 mg | ORAL_CAPSULE | Freq: Three times a day (TID) | ORAL | 0 refills | Status: AC

## 2019-07-23 NOTE — ED Notes (Signed)
See triage note  Presents with some dysuria and freq which started 2 days ago  Also noticed some blood in urine

## 2019-07-23 NOTE — ED Triage Notes (Signed)
C/O urinary frequency and hematuria x 2 days.

## 2019-07-23 NOTE — ED Provider Notes (Signed)
[facility] Emergency Department Provider Note  ____________________________________________  Time seen: Approximately 2:38 PM  I have reviewed the triage vital signs and the nursing notes.   HISTORY  Chief Complaint Dysuria    HPI Elizabeth Gay is a 59 y.o. female presents to the emergency department for evaluation of dysuria, urgency, frequency, for 2 days.  Patient noticed a tinge of blood after urinating this morning.  She has had a kidney stone in the past.  No fevers, nausea, vomiting, abdominal pain, back pain.  Past Medical History:  Diagnosis Date   Bipolar disorder ([place])    Depression    Dyspnea    GERD (gastroesophageal reflux disease)    Headache    History of kidney stones    Hypertension    Pre-diabetes     There are no problems to display for this patient.   Past Surgical History:  Procedure Laterality Date   ABDOMINAL HYSTERECTOMY     partial   CESAREAN SECTION     x5   COLONOSCOPY WITH PROPOFOL N/A 12/16/2017   Procedure: COLONOSCOPY WITH PROPOFOL;  Surgeon: Jonathon Bellows, MD;  Location: [facility] ENDOSCOPY;  Service: Gastroenterology;  Laterality: N/A;   KNEE ARTHROSCOPY Left 04/07/2017   Procedure: ARTHROSCOPY KNEE WITH LOOSE BODY REMOVAL;  Surgeon: Hessie Knows, MD;  Location: [facility] ORS;  Service: Orthopedics;  Laterality: Left;    Prior to Admission medications   Medication Sig Start Date End Date Taking? Authorizing Provider  cephALEXin (KEFLEX) 500 MG capsule Take 1 capsule (500 mg total) by mouth 3 (three) times daily for 10 days. 07/23/19 08/02/19  Laban Emperor, PA-C  docusate sodium (COLACE) 100 MG capsule Take 100 mg by mouth 2 (two) times daily as needed for mild constipation.     [provider]  meclizine (ANTIVERT) 25 MG tablet Take 1 tablet (25 mg total) by mouth 3 (three) times daily as needed for dizziness or nausea. 03/01/18   Carrie Mew, MD    Allergies Patient has no known  allergies.  No family history on file.  Social History Social History   Tobacco Use   Smoking status: Never Smoker   Smokeless tobacco: Never Used  Substance Use Topics   Alcohol use: No   Drug use: No     Review of Systems  Constitutional: No fever/chills Gastrointestinal: No abdominal pain.  No nausea, no vomiting.  Genitourinary: Positive for dysuria, urgency, frequency. Musculoskeletal: Negative for musculoskeletal pain. Skin: Negative for rash, abrasions, lacerations, ecchymosis. Neurological: Negative for headaches   ____________________________________________   PHYSICAL EXAM:  VITAL SIGNS: ED Triage Vitals  Enc Vitals Group     BP 07/23/19 1128 (!) 155/84     Pulse Rate 07/23/19 1128 82     Resp 07/23/19 1128 18     Temp 07/23/19 1128 98.7 F (37.1 C)     Temp Source 07/23/19 1128 Oral     SpO2 07/23/19 1128 96 %     Weight 07/23/19 1109 195 lb 1.7 oz (88.5 kg)     Height 07/23/19 1109 5\' 1"  (1.549 m)     Head Circumference --      Peak Flow --      Pain Score 07/23/19 1109 0     Pain Loc --      Pain Edu? --      Excl. in [place]? --      Constitutional: Alert and oriented. Well appearing and in no acute distress. Eyes: Conjunctivae are normal. PERRL. EOMI. Head:  Atraumatic. ENT:      Ears:      Nose: No congestion/rhinnorhea.      Mouth/Throat: Mucous membranes are moist.  Neck: No stridor. Cardiovascular: Normal rate, regular rhythm.  Good peripheral circulation. Respiratory: Normal respiratory effort without tachypnea or retractions. Lungs CTAB. Good air entry to the bases with no decreased or absent breath sounds. Gastrointestinal: Bowel sounds 4 quadrants. Soft and nontender to palpation. No guarding or rigidity. No palpable masses. No distention. Musculoskeletal: Full range of motion to all extremities. No gross deformities appreciated. Neurologic:  Normal speech and language. No gross focal neurologic deficits are appreciated.  Skin:   Skin is warm, dry and intact. No rash noted. Psychiatric: Mood and affect are normal. Speech and behavior are normal. Patient exhibits appropriate insight and judgement.   ____________________________________________   LABS (all labs ordered are listed, but only abnormal results are displayed)  Labs Reviewed  URINALYSIS, COMPLETE (UACMP) WITH MICROSCOPIC - Abnormal; Notable for the following components:      Result Value   Color, Urine YELLOW (*)    APPearance HAZY (*)    Hgb urine dipstick MODERATE (*)    Protein, ur 30 (*)    Leukocytes,Ua SMALL (*)    Bacteria, UA RARE (*)    All other components within normal limits  URINE CULTURE   ____________________________________________  EKG   ____________________________________________  RADIOLOGY Robinette Haines, personally viewed and evaluated these images (plain radiographs) as part of my medical decision making, as well as reviewing the written report by the radiologist.  CT Renal Stone Study  Result Date: 07/23/2019 CLINICAL DATA:  Presents with some dysuria and freq which started 2 days ago Also noticed some blood in urine EXAM: CT ABDOMEN AND PELVIS WITHOUT CONTRAST TECHNIQUE: Multidetector CT imaging of the abdomen and pelvis was performed following the standard protocol without IV contrast. COMPARISON:  None. FINDINGS: Lower chest: Clear lung bases. Heart normal in size. Small left Bochdalek's hernia. Hepatobiliary: No focal liver abnormality is seen. Status post cholecystectomy. No biliary dilatation. Pancreas: Unremarkable. No pancreatic ductal dilatation or surrounding inflammatory changes. Spleen: Normal in size without focal abnormality. Adrenals/Urinary Tract: Adrenal glands are unremarkable. Kidneys are normal, without renal calculi, focal lesion, or hydronephrosis. Bladder is unremarkable. Stomach/Bowel: Stomach is within normal limits. Appendix appears normal. No evidence of bowel wall thickening, distention, or  inflammatory changes. Vascular/Lymphatic: No significant vascular findings are present. No enlarged abdominal or pelvic lymph nodes. Reproductive: Status post hysterectomy. No adnexal masses. Other: No abdominal wall hernia or abnormality. No abdominopelvic ascites. Musculoskeletal: No acute or significant osseous findings. IMPRESSION: 1. No acute findings within the abdomen or pelvis. No renal or ureteral stones or evidence of obstructive uropathy. 2. Status post cholecystectomy and hysterectomy. No other abnormalities. Electronically Signed   By: Lajean Manes M.D.   On: 07/23/2019 13:19    ____________________________________________    PROCEDURES  Procedure(s) performed:    Procedures    Medications - No data to display   ____________________________________________   INITIAL IMPRESSION / ASSESSMENT AND PLAN / ED COURSE  Pertinent labs & imaging results that were available during my care of the patient were reviewed by me and considered in my medical decision making (see chart for details).  Review of the [place] CSRS was performed in accordance of the [place] prior to dispensing any controlled drugs.     Patient's diagnosis is consistent with urinary tract infection.  Vital signs and exam are reassuring.  Urinalysis consistent with infection.  No  nephrolithiasis on CT scan.  Patient will be discharged home with prescriptions for Keflex.  Patient is to follow up with primary care as directed. Patient is given ED precautions to return to the ED for any worsening or new symptoms.  Shakiya Mcneary was evaluated in Emergency Department on 07/23/2019 for the symptoms described in the history of present illness. She was evaluated in the context of the global COVID-19 pandemic, which necessitated consideration that the patient might be at risk for infection with the SARS-CoV-2 virus that causes COVID-19. Institutional protocols and algorithms that pertain to the evaluation of patients at  risk for COVID-19 are in a state of rapid change based on information released by regulatory bodies including the CDC and federal and state organizations. These policies and algorithms were followed during the patient's care in the ED.   ____________________________________________  FINAL CLINICAL IMPRESSION(S) / ED DIAGNOSES  Final diagnoses:  Acute cystitis with hematuria      NEW MEDICATIONS STARTED DURING THIS VISIT:  ED Discharge Orders         Ordered    cephALEXin (KEFLEX) 500 MG capsule  3 times daily     07/23/19 1430              This chart was dictated using voice recognition software/Dragon. Despite best efforts to proofread, errors can occur which can change the meaning. Any change was purely unintentional.    Laban Emperor, PA-C 07/23/19 1610    Merlyn Lot, MD 07/24/19 [phone]

## 2019-07-25 LAB — URINE CULTURE: Culture: 80000 — AB

## 2019-09-12 ENCOUNTER — Ambulatory Visit
Admission: RE | Admit: 2019-09-12 | Discharge: 2019-09-12 | Disposition: A | Discharge status: Home / Self Care | Payer: Medicare HMO | Attending: Primary Care | Admitting: Primary Care

## 2019-09-12 ENCOUNTER — Other Ambulatory Visit: Payer: Self-pay | Admitting: Primary Care

## 2019-09-12 ENCOUNTER — Other Ambulatory Visit: Payer: Self-pay

## 2019-09-12 ENCOUNTER — Ambulatory Visit
Admission: RE | Admit: 2019-09-12 | Discharge: 2019-09-12 | Disposition: A | Discharge status: Home / Self Care | Payer: Medicare HMO | Source: Ambulatory Visit | Attending: Primary Care | Admitting: Primary Care

## 2019-09-12 DIAGNOSIS — R52 Pain, unspecified: Secondary | ICD-10-CM

## 2020-01-17 ENCOUNTER — Other Ambulatory Visit: Payer: Self-pay

## 2020-01-17 ENCOUNTER — Emergency Department
Admission: EM | Admit: 2020-01-17 | Discharge: 2020-01-17 | Disposition: A | Discharge status: Home / Self Care | Payer: Medicare HMO | Attending: Emergency Medicine | Admitting: Emergency Medicine

## 2020-01-17 DIAGNOSIS — I10 Essential (primary) hypertension: Secondary | ICD-10-CM | POA: Insufficient documentation

## 2020-01-17 DIAGNOSIS — T50Z95A Adverse effect of other vaccines and biological substances, initial encounter: Secondary | ICD-10-CM

## 2020-01-17 DIAGNOSIS — R52 Pain, unspecified: Secondary | ICD-10-CM

## 2020-01-17 DIAGNOSIS — M791 Myalgia, unspecified site: Secondary | ICD-10-CM | POA: Insufficient documentation

## 2020-01-17 DIAGNOSIS — T50B95A Adverse effect of other viral vaccines, initial encounter: Secondary | ICD-10-CM | POA: Diagnosis not present

## 2020-01-17 MED ORDER — KETOROLAC TROMETHAMINE 30 MG/ML IJ SOLN
30.0000 mg | Freq: Once | INTRAMUSCULAR | Status: AC
  Administered 2020-01-17: 30 mg via INTRAMUSCULAR
  Filled 2020-01-17: qty 1

## 2020-01-17 NOTE — ED Triage Notes (Signed)
Pt comes with c/o generalized aches since getting covid booster shot couple days ago. Pt states just aches all over.

## 2020-01-17 NOTE — ED Provider Notes (Signed)
[facility] Emergency Department Provider Note  ____________________________________________  Time seen: Approximately 5:05 PM  I have reviewed the triage vital signs and the nursing notes.   HISTORY  Chief Complaint Generalized Body Aches    HPI Elizabeth Gay is a 59 y.o. female that presents to emergency department for evaluation of body aches following her ModernaCOVID-19 booster on Tuesday.  Patient states that body aches started on Wednesday morning.  She did have a fever on Wednesday but none today.  Body aches continued today and she did not go to work.  She has been alternating Tylenol and Motrin for discomfort.  No headache, shortness of breath, chest pain, nausea, vomiting.   Past Medical History:  Diagnosis Date  . Bipolar disorder ([place])   . Depression   . Dyspnea   . GERD (gastroesophageal reflux disease)   . Headache   . History of kidney stones   . Hypertension   . Pre-diabetes     There are no problems to display for this patient.   Past Surgical History:  Procedure Laterality Date  . ABDOMINAL HYSTERECTOMY     partial  . CESAREAN SECTION     x5  . COLONOSCOPY WITH PROPOFOL N/A 12/16/2017   Procedure: COLONOSCOPY WITH PROPOFOL;  Surgeon: Jonathon Bellows, MD;  Location: [facility] ENDOSCOPY;  Service: Gastroenterology;  Laterality: N/A;  . KNEE ARTHROSCOPY Left 04/07/2017   Procedure: ARTHROSCOPY KNEE WITH LOOSE BODY REMOVAL;  Surgeon: Hessie Knows, MD;  Location: [facility] ORS;  Service: Orthopedics;  Laterality: Left;    Prior to Admission medications   Medication Sig Start Date End Date Taking? Authorizing Provider  docusate sodium (COLACE) 100 MG capsule Take 100 mg by mouth 2 (two) times daily as needed for mild constipation.     [provider]  meclizine (ANTIVERT) 25 MG tablet Take 1 tablet (25 mg total) by mouth 3 (three) times daily as needed for dizziness or nausea. 03/01/18   Carrie Mew, MD     Allergies Patient has no known allergies.  No family history on file.  Social History Social History   Tobacco Use  . Smoking status: Never Smoker  . Smokeless tobacco: Never Used  Vaping Use  . Vaping Use: Never used  Substance Use Topics  . Alcohol use: No  . Drug use: No     Review of Systems  Constitutional: No fever/chills Eyes: No visual changes. No discharge. ENT: Negative for congestion and rhinorrhea. Cardiovascular: No chest pain. Respiratory: Negative for cough. No SOB. Gastrointestinal: No abdominal pain.  No nausea, no vomiting.  No diarrhea.  No constipation. Musculoskeletal: Positive for body aches. Skin: Negative for rash, abrasions, lacerations, ecchymosis. Neurological: Negative for headaches.   ____________________________________________   PHYSICAL EXAM:  VITAL SIGNS: ED Triage Vitals [01/17/20 1618]  Enc Vitals Group     BP (!) 149/71     Pulse Rate 100     Resp 18     Temp 98 F (36.7 C)     Temp src      SpO2 100 %     Weight      Height      Head Circumference      Peak Flow      Pain Score 8     Pain Loc      Pain Edu?      Excl. in [place]?      Constitutional: Alert and oriented. Well appearing and in no acute distress. Eyes: Conjunctivae are normal. PERRL.  EOMI. No discharge. Head: Atraumatic. ENT: No frontal and maxillary sinus tenderness.      Ears: Tympanic membranes pearly gray with good landmarks. No discharge.      Nose: No congestion/rhinnorhea.      Mouth/Throat: Mucous membranes are moist. Oropharynx non-erythematous. Tonsils not enlarged. No exudates. Uvula midline. Neck: No stridor.   Hematological/Lymphatic/Immunilogical: No cervical lymphadenopathy. Cardiovascular: Normal rate, regular rhythm.  Good peripheral circulation. Respiratory: Normal respiratory effort without tachypnea or retractions. Lungs CTAB. Good air entry to the bases with no decreased or absent breath sounds. Gastrointestinal: Bowel sounds  4 quadrants. Soft and nontender to palpation. No guarding or rigidity. No palpable masses. No distention. Musculoskeletal: Full range of motion to all extremities. No gross deformities appreciated. Neurologic:  Normal speech and language. No gross focal neurologic deficits are appreciated.  Skin:  Skin is warm, dry and intact. No rash noted. Psychiatric: Mood and affect are normal. Speech and behavior are normal. Patient exhibits appropriate insight and judgement.   ____________________________________________   LABS (all labs ordered are listed, but only abnormal results are displayed)  Labs Reviewed - No data to display ____________________________________________  EKG   ____________________________________________  RADIOLOGY   No results found.  ____________________________________________    PROCEDURES  Procedure(s) performed:    Procedures    Medications  ketorolac (TORADOL) 30 MG/ML injection 30 mg (30 mg Intramuscular Given 01/17/20 1709)     ____________________________________________   INITIAL IMPRESSION / ASSESSMENT AND PLAN / ED COURSE  Pertinent labs & imaging results that were available during my care of the patient were reviewed by me and considered in my medical decision making (see chart for details).  Review of the [place] CSRS was performed in accordance of the [place] prior to dispensing any controlled drugs.     Patient presented the emergency department for evaluation of body aches following her COVID-19 booster. Vital signs and exam are reassuring.  Patient declines lab work today.  She was given a shot of IM Toradol for body aches.  She requests a work note.  Patient feels comfortable going home. Patient is to follow up with primary care as needed or otherwise directed. Patient is given ED precautions to return to the ED for any worsening or new symptoms.   Elizabeth Gay was evaluated in Emergency Department on 01/17/2020 for the  symptoms described in the history of present illness. She was evaluated in the context of the global COVID-19 pandemic, which necessitated consideration that the patient might be at risk for infection with the SARS-CoV-2 virus that causes COVID-19. Institutional protocols and algorithms that pertain to the evaluation of patients at risk for COVID-19 are in a state of rapid change based on information released by regulatory bodies including the CDC and federal and state organizations. These policies and algorithms were followed during the patient's care in the ED.  ____________________________________________  FINAL CLINICAL IMPRESSION(S) / ED DIAGNOSES  Final diagnoses:  Body aches after vaccination      NEW MEDICATIONS STARTED DURING THIS VISIT:  ED Discharge Orders    None          This chart was dictated using voice recognition software/Dragon. Despite best efforts to proofread, errors can occur which can change the meaning. Any change was purely unintentional.    Laban Emperor, PA-C 01/17/20 1845    Merlyn Lot, MD 01/17/20 [phone]

## 2020-02-25 ENCOUNTER — Ambulatory Visit: Payer: Self-pay | Admitting: Physician Assistant

## 2020-03-20 DIAGNOSIS — M25512 Pain in left shoulder: Secondary | ICD-10-CM | POA: Diagnosis not present

## 2020-04-16 DIAGNOSIS — F3132 Bipolar disorder, current episode depressed, moderate: Secondary | ICD-10-CM | POA: Diagnosis not present

## 2020-04-16 DIAGNOSIS — I10 Essential (primary) hypertension: Secondary | ICD-10-CM | POA: Diagnosis not present

## 2020-05-05 ENCOUNTER — Ambulatory Visit: Payer: Self-pay | Admitting: Physician Assistant

## 2020-05-19 ENCOUNTER — Telehealth: Payer: Self-pay | Admitting: Family Medicine

## 2020-05-19 NOTE — Telephone Encounter (Signed)
pls Call pt back as calling due to wanting to be seen as NPT sooner and Dr Lurline Del appt is of course to be resched? Pt upset FU [phone]

## 2020-05-21 NOTE — Telephone Encounter (Signed)
Left message for patient that 06/12/20 is the soonest NP appt.   Also left names and numbers of sister offices to call if she wanted to try to get in sooner.

## 2020-06-11 NOTE — Progress Notes (Signed)
New patient visit   Patient: Elizabeth Gay   DOB: [birth year]   60 y.o. Female  MRN: [id number] Visit Date: 06/12/2020  Today's healthcare provider: Vernie Murders, PA-C   Chief Complaint  Patient presents with  . New Patient (Initial Visit)  . Migraine  . Constipation   Subjective    Elizabeth Gay is a 60 y.o. female who presents today as a new patient to establish care.  Migraine  This is a chronic problem. The problem occurs daily. The problem has been unchanged. The pain is located in the temporal region. The pain radiates to the left neck and right neck. The pain quality is similar to prior headaches. The quality of the pain is described as throbbing. The pain is mild. Associated symptoms include abdominal pain, back pain, dizziness and sinus pressure. Pertinent negatives include no nausea or vomiting. She has tried antidepressants, triptans and NSAIDs for the symptoms. The treatment provided mild relief. Her past medical history is significant for hypertension and migraine headaches.  Constipation This is a chronic problem. The problem is unchanged. Her stool frequency is 1 time per week or less. The stool is described as firm and formed. The patient is not on a high fiber diet. She exercises regularly. There has been adequate water intake. Associated symptoms include abdominal pain, back pain and bloating. Pertinent negatives include no nausea or vomiting. She has tried laxatives, stool softeners and diet changes for the symptoms. The treatment provided mild relief.    Patient reports last PCP was at [facility].   Past Medical History:  Diagnosis Date  . Allergy   . Anxiety   . Arthritis   . Bipolar disorder ([place])   . Depression   . Dyspnea   . GERD (gastroesophageal reflux disease)   . Headache   . History of kidney stones   . Hypertension   . Pre-diabetes   . Sickle cell anemia (HCC)    Past Surgical History:  Procedure  Laterality Date  . ABDOMINAL HYSTERECTOMY     partial  . CESAREAN SECTION     x5  . COLONOSCOPY WITH PROPOFOL N/A 12/16/2017   Procedure: COLONOSCOPY WITH PROPOFOL;  Surgeon: Jonathon Bellows, MD;  Location: [facility] ENDOSCOPY;  Service: Gastroenterology;  Laterality: N/A;  . KNEE ARTHROSCOPY Left 04/07/2017   Procedure: ARTHROSCOPY KNEE WITH LOOSE BODY REMOVAL;  Surgeon: Hessie Knows, MD;  Location: [facility] ORS;  Service: Orthopedics;  Laterality: Left;   Family Status  Relation Name Status  . Mother  Deceased at age 38  . Sister  Alive  . Son  Alive  . Sister  Alive  . Sister  Alive  . Son  Alive  . Son  Alive  . Son  Alive  . Son  Alive   Family History  Problem Relation Age of Onset  . Diabetes Mother   . Throat cancer Mother   . Hypertension Sister   . Healthy Son   . Healthy Son   . Healthy Son   . Healthy Son   . Healthy Son    Social History   Socioeconomic History  . Marital status: Single    Spouse name: Not on file  . Number of children: 5  . Years of education: Not on file  . Highest education level: Not on file  Occupational History  . Not on file  Tobacco Use  . Smoking status: Never Smoker  . Smokeless tobacco: Never Used  Vaping Use  .  Vaping Use: Never used  Substance and Sexual Activity  . Alcohol use: No  . Drug use: No  . Sexual activity: Not on file  Other Topics Concern  . Not on file  Social History Narrative  . Not on file   Social Determinants of Health   Financial Resource Strain: Not on file  Food Insecurity: Not on file  Transportation Needs: Not on file  Physical Activity: Not on file  Stress: Not on file  Social Connections: Not on file   Outpatient Medications Prior to Visit  Medication Sig  . ALPRAZolam (XANAX) 1 MG tablet Take 1 mg by mouth as needed.  Marland Kitchen buPROPion (WELLBUTRIN) 100 MG tablet Take 1 tablet by mouth as needed.  . clonazePAM (KLONOPIN) 0.5 MG tablet Take 1 tablet by mouth as needed.  . docusate sodium (COLACE) 100  MG capsule Take 100 mg by mouth 2 (two) times daily as needed for mild constipation.   . meclizine (ANTIVERT) 25 MG tablet Take 1 tablet (25 mg total) by mouth 3 (three) times daily as needed for dizziness or nausea.  . metoprolol succinate (TOPROL-XL) 25 MG 24 hr tablet Take 1 tablet by mouth daily.  . QUEtiapine (SEROQUEL) 300 MG tablet Take 1 tablet by mouth as needed.  . sertraline (ZOLOFT) 100 MG tablet Take 1 tablet by mouth as needed.   No facility-administered medications prior to visit.   Allergies  Allergen Reactions  . Tetanus Toxoid Nausea Only    Patient stated she is NOT allergic to tetanus Patient stated she is NOT allergic to tetanus     Immunization History  Administered Date(s) Administered  . DTaP / IPV 08/08/2012  . Moderna Sars-Covid-2 Vaccination 05/24/2019, 06/21/2019, 01/15/2020  . Tdap 10/21/2015    Health Maintenance  Topic Date Due  . Hepatitis C Screening  Never done  . HIV Screening  Never done  . PAP SMEAR-Modifier  Never done  . MAMMOGRAM  Never done  . INFLUENZA VACCINE  09/15/2020  . TETANUS/TDAP  10/20/2025  . COLONOSCOPY (Pts 45-48yrs Insurance coverage will need to be confirmed)  12/17/2027  . COVID-19 Vaccine  Completed  . HPV VACCINES  Aged Out    Patient Care Team: Donneisha Beane, Vickki Muff, PA-C as PCP - General (Family Medicine) Freddy Finner, NP (Nurse Practitioner)  Review of Systems  Constitutional: Positive for appetite change and fatigue.  HENT: Positive for dental problem and sinus pressure.   Eyes: Negative.   Respiratory: Positive for shortness of breath.   Cardiovascular: Negative.   Gastrointestinal: Positive for abdominal distention, abdominal pain, bloating and constipation. Negative for nausea and vomiting.  Endocrine: Positive for polyphagia.  Genitourinary: Negative.   Musculoskeletal: Positive for arthralgias and back pain.  Skin: Negative.   Allergic/Immunologic: Negative.   Neurological: Positive for dizziness,  light-headedness and headaches.  Hematological: Negative.   Psychiatric/Behavioral: Positive for sleep disturbance. The patient is nervous/anxious.     Last CBC Lab Results  Component Value Date   WBC 9.6 06/22/2019   HGB 11.6 (L) 06/22/2019   HCT 35.9 (L) 06/22/2019   MCV 74.5 (L) 06/22/2019   MCH 24.1 (L) 06/22/2019   RDW 17.6 (H) 06/22/2019   PLT 296 96/22/2979   Last metabolic panel Lab Results  Component Value Date   GLUCOSE 83 06/22/2019   NA 142 06/22/2019   K 3.6 06/22/2019   CL 108 06/22/2019   CO2 25 06/22/2019   BUN 10 06/22/2019   CREATININE 0.74 06/22/2019   GFRNONAA >60 06/22/2019  GFRAA >60 06/22/2019   CALCIUM 8.7 (L) 06/22/2019   PROT 7.9 06/22/2019   ALBUMIN 3.9 06/22/2019   BILITOT 0.7 06/22/2019   ALKPHOS 109 06/22/2019   AST 16 06/22/2019   ALT 15 06/22/2019   ANIONGAP 9 06/22/2019      Objective    BP 140/68 (BP Location: Left Arm, Patient Position: Sitting, Cuff Size: Large)   Pulse 72   Temp 98.6 F (37 C) (Oral)   Resp 16   Ht 5\' 1"  (1.549 m)   Wt 188 lb 3.2 oz (85.4 kg)   BMI 35.56 kg/m  Physical Exam Constitutional:      General: She is not in acute distress.    Appearance: She is well-developed.  HENT:     Head: Normocephalic and atraumatic.     Right Ear: Hearing and tympanic membrane normal.     Left Ear: Hearing and tympanic membrane normal.     Nose: Nose normal.  Eyes:     General: Lids are normal. No scleral icterus.       Right eye: No discharge.        Left eye: No discharge.     Conjunctiva/sclera: Conjunctivae normal.  Cardiovascular:     Rate and Rhythm: Normal rate and regular rhythm.     Pulses: Normal pulses.  Pulmonary:     Effort: Pulmonary effort is normal. No respiratory distress.  Abdominal:     General: Bowel sounds are normal.     Palpations: Abdomen is soft.  Musculoskeletal:        General: Normal range of motion.     Cervical back: Normal range of motion.  Skin:    Findings: No lesion or  rash.     Comments: Some soreness to palpate temples bilaterally.  Neurological:     Mental Status: She is alert and oriented to person, place, and time.  Psychiatric:        Speech: Speech normal.        Behavior: Behavior normal.        Thought Content: Thought content normal.     Comments: Happy mood today.      Depression Screen PHQ 2/9 Scores 06/12/2020  PHQ - 2 Score 4  PHQ- 9 Score 13   No results found for any visits on 06/12/20.  Assessment & Plan      1. Migraine variant with headache Has had headaches since she was a child. This headache starteda month ago. Has decreased in intensity and no longer having aura. No nausea or vomiting today. Able to continue to work at [employer]. Using Xanax and Clonazepam daily. Will check labs and may try Aleve for dull headache today. May be having some rebound headache with use of two benzodiazepines. Given diet for headache patient and advised to taper off these drugs. May need a triptan or referral to a neurologist with setting up with a new psychiatrist. - CBC with Differential/Platelet - Comprehensive metabolic panel - TSH  2. History of anemia Not taking any iron or vitamin supplements. Will recheck labs. - CBC with Differential/Platelet - TSH  3. Bipolar disorder in remission ([place]) Well controlled on Xanax, Clonazepam, Zoloft, Seroquel and Wellbutrin. No longer seeing psychiatrist due to change in insurance.  No follow-ups on file.     I, Verlon Carcione, PA-C, have reviewed all documentation for this visit. The documentation on 06/12/20 for the exam, diagnosis, procedures, and orders are all accurate and complete.    Vernie Murders, PA-C  [place] [phone] (phone) [phone] (fax)  [place]

## 2020-06-12 ENCOUNTER — Ambulatory Visit (INDEPENDENT_AMBULATORY_CARE_PROVIDER_SITE_OTHER): Payer: Medicare HMO | Admitting: Family Medicine

## 2020-06-12 ENCOUNTER — Encounter: Payer: Self-pay | Admitting: Family Medicine

## 2020-06-12 ENCOUNTER — Other Ambulatory Visit: Payer: Self-pay

## 2020-06-12 VITALS — BP 140/68 | HR 72 | Temp 98.6°F | Resp 16 | Ht 61.0 in | Wt 188.2 lb

## 2020-06-12 DIAGNOSIS — F3170 Bipolar disorder, currently in remission, most recent episode unspecified: Secondary | ICD-10-CM

## 2020-06-12 DIAGNOSIS — F3132 Bipolar disorder, current episode depressed, moderate: Secondary | ICD-10-CM | POA: Diagnosis not present

## 2020-06-12 DIAGNOSIS — Z862 Personal history of diseases of the blood and blood-forming organs and certain disorders involving the immune mechanism: Secondary | ICD-10-CM

## 2020-06-12 DIAGNOSIS — G43809 Other migraine, not intractable, without status migrainosus: Secondary | ICD-10-CM | POA: Diagnosis not present

## 2020-06-12 DIAGNOSIS — I10 Essential (primary) hypertension: Secondary | ICD-10-CM | POA: Diagnosis not present

## 2020-06-12 DIAGNOSIS — K219 Gastro-esophageal reflux disease without esophagitis: Secondary | ICD-10-CM | POA: Diagnosis not present

## 2020-06-13 LAB — CBC WITH DIFFERENTIAL/PLATELET
Basophils Absolute: 0 10*3/uL (ref 0.0–0.2)
Basos: 0 %
EOS (ABSOLUTE): 0.2 10*3/uL (ref 0.0–0.4)
Eos: 3 %
Hematocrit: 34.5 % (ref 34.0–46.6)
Hemoglobin: 11.3 g/dL (ref 11.1–15.9)
Immature Grans (Abs): 0 10*3/uL (ref 0.0–0.1)
Immature Granulocytes: 0 %
Lymphocytes Absolute: 3.2 10*3/uL — ABNORMAL HIGH (ref 0.7–3.1)
Lymphs: 48 %
MCH: 24.1 pg — ABNORMAL LOW (ref 26.6–33.0)
MCHC: 32.8 g/dL (ref 31.5–35.7)
MCV: 74 fL — ABNORMAL LOW (ref 79–97)
Monocytes Absolute: 0.4 10*3/uL (ref 0.1–0.9)
Monocytes: 6 %
Neutrophils Absolute: 2.9 10*3/uL (ref 1.4–7.0)
Neutrophils: 43 %
Platelets: 327 10*3/uL (ref 150–450)
RBC: 4.69 x10E6/uL (ref 3.77–5.28)
RDW: 16.6 % — ABNORMAL HIGH (ref 11.7–15.4)
WBC: 6.8 10*3/uL (ref 3.4–10.8)

## 2020-06-13 LAB — COMPREHENSIVE METABOLIC PANEL
ALT: 13 IU/L (ref 0–32)
AST: 15 IU/L (ref 0–40)
Albumin/Globulin Ratio: 1.2 (ref 1.2–2.2)
Albumin: 4.2 g/dL (ref 3.8–4.9)
Alkaline Phosphatase: 137 IU/L — ABNORMAL HIGH (ref 44–121)
BUN/Creatinine Ratio: 14 (ref 12–28)
BUN: 9 mg/dL (ref 8–27)
Bilirubin Total: 0.3 mg/dL (ref 0.0–1.2)
CO2: 26 mmol/L (ref 20–29)
Calcium: 9.3 mg/dL (ref 8.7–10.3)
Chloride: 105 mmol/L (ref 96–106)
Creatinine, Ser: 0.64 mg/dL (ref 0.57–1.00)
Globulin, Total: 3.4 g/dL (ref 1.5–4.5)
Glucose: 91 mg/dL (ref 65–99)
Potassium: 4.4 mmol/L (ref 3.5–5.2)
Sodium: 143 mmol/L (ref 134–144)
Total Protein: 7.6 g/dL (ref 6.0–8.5)
eGFR: 101 mL/min/{1.73_m2} (ref >59)

## 2020-06-13 LAB — TSH: TSH: 0.976 u[IU]/mL (ref 0.450–4.500)

## 2020-06-18 ENCOUNTER — Telehealth: Payer: Self-pay | Admitting: *Deleted

## 2020-06-18 NOTE — Telephone Encounter (Signed)
Copied from [place] [id number]. Topic: General - Other >> Jun 18, 2020 12:35 PM Pawlus, Brayton Layman A wrote: Reason for CRM: Pt had some follow up questions regarding her recent labs from 4/28, requested a call back.

## 2020-06-18 NOTE — Telephone Encounter (Signed)
Please advise lab results from 06/12/2020?

## 2020-06-19 NOTE — Telephone Encounter (Signed)
Pt. Given results and instructions. Verbalizes understanding. 

## 2020-06-19 NOTE — Telephone Encounter (Signed)
LMOVM for pt to return call. Okay for [facility] triage to give patient results.  

## 2020-06-19 NOTE — Telephone Encounter (Signed)
Borderline normal hemoglobin with some microcytosis and slight elevation of alkaline phosphatase. Recommend multivitamin with iron daily and recheck levels in 3 months. Recommend scheduling follow up with psychiatrist and if headaches are persisting after using Naproxen and tapering down on Xanax and Clonazepam for 10-14 days, will schedule referral to neurologist

## 2020-07-03 DIAGNOSIS — D649 Anemia, unspecified: Secondary | ICD-10-CM | POA: Diagnosis not present

## 2020-07-04 DIAGNOSIS — R7309 Other abnormal glucose: Secondary | ICD-10-CM | POA: Diagnosis not present

## 2020-07-04 DIAGNOSIS — F3132 Bipolar disorder, current episode depressed, moderate: Secondary | ICD-10-CM | POA: Diagnosis not present

## 2020-07-04 DIAGNOSIS — I10 Essential (primary) hypertension: Secondary | ICD-10-CM | POA: Diagnosis not present

## 2020-07-04 DIAGNOSIS — E669 Obesity, unspecified: Secondary | ICD-10-CM | POA: Diagnosis not present

## 2020-07-17 DIAGNOSIS — F3132 Bipolar disorder, current episode depressed, moderate: Secondary | ICD-10-CM | POA: Diagnosis not present

## 2020-07-17 DIAGNOSIS — I10 Essential (primary) hypertension: Secondary | ICD-10-CM | POA: Diagnosis not present

## 2020-08-08 ENCOUNTER — Ambulatory Visit: Payer: Medicare HMO | Admitting: Family Medicine

## 2020-09-16 DIAGNOSIS — M160 Bilateral primary osteoarthritis of hip: Secondary | ICD-10-CM | POA: Diagnosis not present

## 2020-09-16 DIAGNOSIS — M7061 Trochanteric bursitis, right hip: Secondary | ICD-10-CM | POA: Diagnosis not present

## 2020-09-16 DIAGNOSIS — M7062 Trochanteric bursitis, left hip: Secondary | ICD-10-CM | POA: Diagnosis not present

## 2020-09-17 DIAGNOSIS — Z599 Problem related to housing and economic circumstances, unspecified: Secondary | ICD-10-CM | POA: Diagnosis not present

## 2020-09-17 DIAGNOSIS — E669 Obesity, unspecified: Secondary | ICD-10-CM | POA: Diagnosis not present

## 2020-09-17 DIAGNOSIS — F3132 Bipolar disorder, current episode depressed, moderate: Secondary | ICD-10-CM | POA: Diagnosis not present

## 2020-10-01 DIAGNOSIS — M7062 Trochanteric bursitis, left hip: Secondary | ICD-10-CM | POA: Diagnosis not present

## 2020-10-01 DIAGNOSIS — M7061 Trochanteric bursitis, right hip: Secondary | ICD-10-CM | POA: Diagnosis not present

## 2020-10-01 DIAGNOSIS — M160 Bilateral primary osteoarthritis of hip: Secondary | ICD-10-CM | POA: Diagnosis not present

## 2020-10-01 DIAGNOSIS — M1611 Unilateral primary osteoarthritis, right hip: Secondary | ICD-10-CM | POA: Diagnosis not present

## 2020-10-01 DIAGNOSIS — M1612 Unilateral primary osteoarthritis, left hip: Secondary | ICD-10-CM | POA: Diagnosis not present

## 2021-01-26 ENCOUNTER — Other Ambulatory Visit: Payer: Self-pay

## 2021-01-26 ENCOUNTER — Emergency Department
Admission: EM | Admit: 2021-01-26 | Discharge: 2021-01-26 | Disposition: A | Discharge status: Home / Self Care | Payer: Medicare HMO | Attending: Emergency Medicine | Admitting: Emergency Medicine

## 2021-01-26 ENCOUNTER — Emergency Department: Payer: Medicare HMO

## 2021-01-26 DIAGNOSIS — M25551 Pain in right hip: Secondary | ICD-10-CM

## 2021-01-26 DIAGNOSIS — I10 Essential (primary) hypertension: Secondary | ICD-10-CM | POA: Diagnosis not present

## 2021-01-26 DIAGNOSIS — Z79899 Other long term (current) drug therapy: Secondary | ICD-10-CM | POA: Insufficient documentation

## 2021-01-26 DIAGNOSIS — M7060 Trochanteric bursitis, unspecified hip: Secondary | ICD-10-CM

## 2021-01-26 DIAGNOSIS — M7061 Trochanteric bursitis, right hip: Secondary | ICD-10-CM | POA: Insufficient documentation

## 2021-01-26 DIAGNOSIS — Y9301 Activity, walking, marching and hiking: Secondary | ICD-10-CM | POA: Diagnosis not present

## 2021-01-26 NOTE — ED Triage Notes (Signed)
Pt  reports right leg pain for several months, has been to PT for same. Reports pain caused fall this am. Denies hitting head. Reports pain to right leg radiating to right groin at this time. NAD noted. ambulatory

## 2021-01-26 NOTE — Discharge Instructions (Addendum)
Dr. Rudene Christians thought that you had trochanteric bursitis.  This is an inflammation of the cushion between the bone and the tendons that work the leg.  This can flareup.  A flareup could make the tendons more painful as they pass over the bursa and that could be what was causing your pain.  Of course the fall would have strained everything and made it worse.  There is no sign of fracture on your x-rays.  I will have you take Motrin 3 of the over-the-counter pills 3 times a day for 3 days.  Take it with food.  I would not take it for longer than that.  Taking it for longer may adversely affect your blood pressure or your kidneys.  Please return for worsening pain or follow-up with Dr. Rudene Christians.  If you are not going to see him until late in January you might want to give him a call and have him see you sooner in early January especially if this brief dosage of ibuprofen or Motrin does not help.  I would not take the Motrin any longer than 3 days and not any higher dosage.  You can also use Tylenol if you need to for pain.

## 2021-01-26 NOTE — ED Provider Notes (Signed)
[facility] Emergency Department Provider Note   ____________________________________________   Event Date/Time   First MD Initiated Contact with Patient 01/26/21 1827     (approximate)  I have reviewed the triage vital signs and the nursing notes.   HISTORY  Chief Complaint Leg Pain    HPI Elizabeth Gay is a 60 y.o. female who had been seeing Dr. Rudene Christians and getting physical therapy for bilateral trochanteric bursitis.  She reports she was walking and had a worse pain in the right hip and lateral groin.  It was so bad that she fell on almost hit her hip again except for that she was able to slow her fall by grabbing onto a coat rack.  She is able to walk now without any worse difficulty than usual.  She does complain of pain around the right hip and into the lateral right groin area. She has a history of hypertension which is usually fairly well controlled but today its more elevated she thinks possibly due to the pain.  Her last several blood glucoses have been within normal limits.  She reports she cannot take Motrin or Naprosyn.  She did not hit anything else.  She did not hit her head.  She did not land hard.  And she has no other pain or injury.  Again she can walk without difficulty.     Past Medical History:  Diagnosis Date   Allergy    Anxiety    Arthritis    Bipolar disorder (HCC)    Depression    Dyspnea    GERD (gastroesophageal reflux disease)    Headache    History of kidney stones    Hypertension    Pre-diabetes    Sickle cell anemia (HCC)     There are no problems to display for this patient.   Past Surgical History:  Procedure Laterality Date   ABDOMINAL HYSTERECTOMY     partial   CESAREAN SECTION     x5   COLONOSCOPY WITH PROPOFOL N/A 12/16/2017   Procedure: COLONOSCOPY WITH PROPOFOL;  Surgeon: Jonathon Bellows, MD;  Location: [facility] ENDOSCOPY;  Service: Gastroenterology;  Laterality: N/A;   KNEE ARTHROSCOPY Left 04/07/2017    Procedure: ARTHROSCOPY KNEE WITH LOOSE BODY REMOVAL;  Surgeon: Hessie Knows, MD;  Location: [facility] ORS;  Service: Orthopedics;  Laterality: Left;    Prior to Admission medications   Medication Sig Start Date End Date Taking? Authorizing Provider  ALPRAZolam Duanne Moron) 1 MG tablet Take 1 mg by mouth as needed.    [provider]  buPROPion (WELLBUTRIN) 100 MG tablet Take 1 tablet by mouth as needed. 05/31/13   [provider]  clonazePAM (KLONOPIN) 0.5 MG tablet Take 1 tablet by mouth as needed.    [provider]  docusate sodium (COLACE) 100 MG capsule Take 100 mg by mouth 2 (two) times daily as needed for mild constipation.     [provider]  meclizine (ANTIVERT) 25 MG tablet Take 1 tablet (25 mg total) by mouth 3 (three) times daily as needed for dizziness or nausea. 03/01/18   Carrie Mew, MD  metoprolol succinate (TOPROL-XL) 25 MG 24 hr tablet Take 1 tablet by mouth daily.    [provider]  QUEtiapine (SEROQUEL) 300 MG tablet Take 1 tablet by mouth as needed.    [provider]  sertraline (ZOLOFT) 100 MG tablet Take 1 tablet by mouth as needed.    [provider]    Allergies Tetanus toxoid  Family History  Problem Relation Age of Onset   Diabetes Mother    Throat cancer Mother    Hypertension Sister    Healthy Son    Healthy Son    Healthy Son    Healthy Son    Healthy Son     Social History Social History   Tobacco Use   Smoking status: Never   Smokeless tobacco: Never  Vaping Use   Vaping Use: Never used  Substance Use Topics   Alcohol use: No   Drug use: No    Review of Systems  Constitutional: No fever/chills Eyes: No visual changes. ENT: No sore throat. Cardiovascular: Denies chest pain. Respiratory: Denies shortness of breath. Gastrointestinal: No abdominal pain.  No nausea, no vomiting.  No diarrhea.  No constipation. Genitourinary: Negative for dysuria. Musculoskeletal: Negative  for back pain. Skin: Negative for rash. Neurological: Negative for headaches, focal weakness or numbness.   ____________________________________________   PHYSICAL EXAM:  VITAL SIGNS: ED Triage Vitals  Enc Vitals Group     BP 01/26/21 1543 (!) 145/99     Pulse Rate 01/26/21 1543 89     Resp 01/26/21 1543 17     Temp 01/26/21 1543 98.4 F (36.9 C)     Temp Source 01/26/21 1543 Oral     SpO2 01/26/21 1543 96 %     Weight 01/26/21 1543 180 lb (81.6 kg)     Height 01/26/21 1543 5\' 1"  (1.549 m)     Head Circumference --      Peak Flow --      Pain Score 01/26/21 1543 6     Pain Loc --      Pain Edu? --      Excl. in [place]? --     Constitutional: Alert and oriented. Well appearing and in no acute distress. Eyes: Conjunctivae are normal.  Head: Atraumatic. Nose: No congestion/rhinnorhea. Mouth/Throat: Mucous membranes are moist.   Neck: No stridor. Cardiovascular:  Good peripheral circulation. Respiratory: Normal respiratory effort.  No retractions.  Musculoskeletal: Left leg with minimal tenderness over the greater trochanter right leg has more tenderness and there is tenderness anterior to the hip into the first couple inches of the groin fold and also just posterior to the hip. Neurologic:  Normal speech and language. No gross focal neurologic deficits are appreciated.    ____________________________________________   LABS (all labs ordered are listed, but only abnormal results are displayed)  Labs Reviewed - No data to display ____________________________________________  EKG   ____________________________________________  RADIOLOGY Gertha Calkin, personally viewed and evaluated these images (plain radiographs) as part of my medical decision making, as well as reviewing the written report by the radiologist.  ED MD interpretation: X-rays read by radiology reviewed by me show no fracture.  Official radiology report(s): DG Hip Unilat With Pelvis 2-3 Views  Right  Result Date: 01/26/2021 CLINICAL DATA:  Hip pain, RIGHT leg pain for several months. EXAM: DG HIP (WITH OR WITHOUT PELVIS) 2-3V RIGHT COMPARISON:  September 12, 2019. FINDINGS: No sign of fracture about the bony pelvis. RIGHT hip is located without signs of fracture. Degenerative changes are noted about the bilateral hips. IMPRESSION: Degenerative changes about the bilateral hips. No acute findings. Electronically Signed   By: Zetta Bills M.D.   On: 01/26/2021 16:20    ____________________________________________   PROCEDURES  Procedure(s) performed (including Critical Care):  Procedures   ____________________________________________   INITIAL IMPRESSION / ASSESSMENT AND PLAN / ED COURSE  Patient  is able to walk so it is unlikely she has any fractures and none are seen on the x-rays.  Walking would tend to rule out an occult fracture.  It is possible that she strained her hip or that there was a flareup of the trochanteric bursitis.  I will treat her with a brief pulse of ibuprofen.  I will have her take 3 of the over-the-counter pills 3 times a day with meals for 3 days.  This should not affect her kidneys or blood pressure adversely.  Her kidney function has been normal.              ____________________________________________   FINAL CLINICAL IMPRESSION(S) / ED DIAGNOSES  Final diagnoses:  Right hip pain  Trochanteric bursitis, unspecified laterality     ED Discharge Orders     None        Note:  This document was prepared using Dragon voice recognition software and may include unintentional dictation errors.    Nena Polio, MD 01/26/21 [phone]

## 2021-01-26 NOTE — ED Provider Notes (Signed)
  Emergency Medicine Provider Triage Evaluation Note  Elizabeth Gay , a 60 y.o.female,  was evaluated in triage.  Pt complains of hip/leg pain.  Patient states that she has been experiencing right leg pain for the past 3 to 4 months.  She has received x-rays and has been involved and physical therapy for this.  She states that her pain has been relatively stable over the past months, however when she woke up today she felt a significant amount of pain in her right hip/groin/proximal thigh.  Denies fever/chills, numbness/tingling in extremities, chest pain, shortness of breath, or abdominal pain.   Review of Systems  Positive: Right-sided hip/leg pain. Negative: Denies fever, chest pain, vomiting  Physical Exam   Vitals:   01/26/21 1543 01/26/21 1543  BP:  (!) 145/99  Pulse:  89  Resp:  17  Temp: 98.4 F (36.9 C) 98.4 F (36.9 C)  SpO2:  96%   Gen:   Awake, no distress   Resp:  Normal effort  MSK:   Moves extremities without difficulty  Other:  Endorses significant tenderness when palpating the right hip and proximal thigh.  Medical Decision Making  Given the patient's initial medical screening exam, the following diagnostic evaluation has been ordered. The patient will be placed in the appropriate treatment space, once one is available, to complete the evaluation and treatment. I have discussed the plan of care with the patient and I have advised the patient that an ED physician or mid-level practitioner will reevaluate their condition after the test results have been received, as the results may give them additional insight into the type of treatment they may need.    Diagnostics: Hip x-ray.  Treatments: none immediately   Teodoro Spray, [place] 01/26/21 1551    Nance Pear, MD 01/26/21 2021

## 2021-02-25 ENCOUNTER — Other Ambulatory Visit: Payer: Self-pay | Admitting: Internal Medicine

## 2021-02-25 DIAGNOSIS — Z1231 Encounter for screening mammogram for malignant neoplasm of breast: Secondary | ICD-10-CM

## 2021-03-09 ENCOUNTER — Other Ambulatory Visit: Payer: Self-pay | Admitting: Internal Medicine

## 2021-03-09 DIAGNOSIS — Z8249 Family history of ischemic heart disease and other diseases of the circulatory system: Secondary | ICD-10-CM

## 2021-03-09 DIAGNOSIS — R0602 Shortness of breath: Secondary | ICD-10-CM

## 2021-04-22 ENCOUNTER — Other Ambulatory Visit: Payer: Medicare HMO

## 2021-07-01 DIAGNOSIS — G43109 Migraine with aura, not intractable, without status migrainosus: Secondary | ICD-10-CM | POA: Diagnosis not present

## 2021-07-09 DIAGNOSIS — H524 Presbyopia: Secondary | ICD-10-CM | POA: Diagnosis not present

## 2021-07-31 DIAGNOSIS — R110 Nausea: Secondary | ICD-10-CM | POA: Diagnosis not present

## 2021-07-31 DIAGNOSIS — H53149 Visual discomfort, unspecified: Secondary | ICD-10-CM | POA: Diagnosis not present

## 2021-07-31 DIAGNOSIS — F40298 Other specified phobia: Secondary | ICD-10-CM | POA: Diagnosis not present

## 2021-07-31 DIAGNOSIS — Z8659 Personal history of other mental and behavioral disorders: Secondary | ICD-10-CM | POA: Diagnosis not present

## 2021-07-31 DIAGNOSIS — G479 Sleep disorder, unspecified: Secondary | ICD-10-CM | POA: Diagnosis not present

## 2021-07-31 DIAGNOSIS — M542 Cervicalgia: Secondary | ICD-10-CM | POA: Diagnosis not present

## 2021-07-31 DIAGNOSIS — G43019 Migraine without aura, intractable, without status migrainosus: Secondary | ICD-10-CM | POA: Diagnosis not present

## 2021-08-01 ENCOUNTER — Emergency Department
Admission: EM | Admit: 2021-08-01 | Discharge: 2021-08-01 | Disposition: A | Discharge status: Home / Self Care | Payer: Medicare HMO | Attending: Emergency Medicine | Admitting: Emergency Medicine

## 2021-08-01 ENCOUNTER — Encounter: Payer: Self-pay | Admitting: Emergency Medicine

## 2021-08-01 DIAGNOSIS — N390 Urinary tract infection, site not specified: Secondary | ICD-10-CM | POA: Diagnosis not present

## 2021-08-01 DIAGNOSIS — R509 Fever, unspecified: Secondary | ICD-10-CM | POA: Diagnosis not present

## 2021-08-01 LAB — COMPREHENSIVE METABOLIC PANEL
ALT: 42 U/L (ref 0–44)
AST: 47 U/L — ABNORMAL HIGH (ref 15–41)
Albumin: 3.7 g/dL (ref 3.5–5.0)
Alkaline Phosphatase: 121 U/L (ref 38–126)
Anion gap: 6 (ref 5–15)
BUN: 9 mg/dL (ref 8–23)
CO2: 27 mmol/L (ref 22–32)
Calcium: 8.6 mg/dL — ABNORMAL LOW (ref 8.9–10.3)
Chloride: 108 mmol/L (ref 98–111)
Creatinine, Ser: 0.77 mg/dL (ref 0.44–1.00)
GFR, Estimated: >60 mL/min (ref >60)
Glucose, Bld: 127 mg/dL — ABNORMAL HIGH (ref 70–99)
Potassium: 3.1 mmol/L — ABNORMAL LOW (ref 3.5–5.1)
Sodium: 141 mmol/L (ref 135–145)
Total Bilirubin: 1 mg/dL (ref 0.3–1.2)
Total Protein: 8 g/dL (ref 6.5–8.1)

## 2021-08-01 LAB — CBC WITH DIFFERENTIAL/PLATELET
Abs Immature Granulocytes: 0.04 10*3/uL (ref 0.00–0.07)
Basophils Absolute: 0 10*3/uL (ref 0.0–0.1)
Basophils Relative: 0 %
Eosinophils Absolute: 0.1 10*3/uL (ref 0.0–0.5)
Eosinophils Relative: 1 %
HCT: 35 % — ABNORMAL LOW (ref 36.0–46.0)
Hemoglobin: 11.1 g/dL — ABNORMAL LOW (ref 12.0–15.0)
Immature Granulocytes: 0 %
Lymphocytes Relative: 21 %
Lymphs Abs: 2.2 10*3/uL (ref 0.7–4.0)
MCH: 24 pg — ABNORMAL LOW (ref 26.0–34.0)
MCHC: 31.7 g/dL (ref 30.0–36.0)
MCV: 75.6 fL — ABNORMAL LOW (ref 80.0–100.0)
Monocytes Absolute: 1.1 10*3/uL — ABNORMAL HIGH (ref 0.1–1.0)
Monocytes Relative: 11 %
Neutro Abs: 7 10*3/uL (ref 1.7–7.7)
Neutrophils Relative %: 67 %
Platelets: 270 10*3/uL (ref 150–400)
RBC: 4.63 MIL/uL (ref 3.87–5.11)
RDW: 16.6 % — ABNORMAL HIGH (ref 11.5–15.5)
WBC: 10.5 10*3/uL (ref 4.0–10.5)
nRBC: 0 % (ref 0.0–0.2)

## 2021-08-01 LAB — URINALYSIS, ROUTINE W REFLEX MICROSCOPIC
Bilirubin Urine: NEGATIVE
Glucose, UA: NEGATIVE mg/dL
Ketones, ur: NEGATIVE mg/dL
Nitrite: NEGATIVE
Protein, ur: NEGATIVE mg/dL
Specific Gravity, Urine: 1.012 (ref 1.005–1.030)
WBC, UA: >50 WBC/hpf — ABNORMAL HIGH (ref 0–5)
pH: 5 (ref 5.0–8.0)

## 2021-08-01 MED ORDER — CEFDINIR 300 MG PO CAPS: 300.0000 mg | ORAL_CAPSULE | Freq: Two times a day (BID) | ORAL | 0 refills | Status: AC

## 2021-08-01 NOTE — ED Provider Notes (Signed)
[facility] Provider Note   Event Date/Time   First MD Initiated Contact with Patient 08/01/21 0703     (approximate) History  Fever  HPI Elizabeth Gay is a 61 y.o. female  Location: Generalized Duration: 2 days Timing: Intermittent Severity: Moderate Quality: Fever Context: Patient states that she came home 2 days prior to arrival and felt like she was chilled and when taking her temperature found it to be elevated.  Patient has been taking ibuprofen and Tylenol Modifying factors: Fever improved with ibuprofen/Tylenol and denies any exacerbating factors Associated Symptoms: Urinary frequency and dysuria ROS: Patient currently denies any vision changes, tinnitus, difficulty speaking, facial droop, sore throat, chest pain, shortness of breath, abdominal pain, nausea/vomiting/diarrhea, or weakness/numbness/paresthesias in any extremity   Physical Exam  Triage Vital Signs: ED Triage Vitals  Enc Vitals Group     BP 08/01/21 0657 98/69     Pulse Rate 08/01/21 0657 100     Resp 08/01/21 0657 20     Temp 08/01/21 0657 99 F (37.2 C)     Temp Source 08/01/21 0657 Oral     SpO2 08/01/21 0657 96 %     Weight 08/01/21 0656 175 lb (79.4 kg)     Height 08/01/21 0656 '5\' 1"'$  (1.549 m)     Head Circumference --      Peak Flow --      Pain Score 08/01/21 0656 7     Pain Loc --      Pain Edu? --      Excl. in [place]? --    Most recent vital signs: Vitals:   08/01/21 0657  BP: 98/69  Pulse: 100  Resp: 20  Temp: 99 F (37.2 C)  SpO2: 96%   General: Awake, oriented x4. CV:  Good peripheral perfusion.  Resp:  Normal effort.  Abd:  No distention.  Other:  Middle-aged overweight African-American female laying on stretcher in no acute distress ED Results / Procedures / Treatments  Labs (all labs ordered are listed, but only abnormal results are displayed) Labs Reviewed  URINALYSIS, ROUTINE W REFLEX MICROSCOPIC - Abnormal; Notable for the following  components:      Result Value   Color, Urine YELLOW (*)    APPearance CLOUDY (*)    Hgb urine dipstick MODERATE (*)    Leukocytes,Ua MODERATE (*)    WBC, UA >50 (*)    Bacteria, UA RARE (*)    All other components within normal limits  CBC WITH DIFFERENTIAL/PLATELET - Abnormal; Notable for the following components:   Hemoglobin 11.1 (*)    HCT 35.0 (*)    MCV 75.6 (*)    MCH 24.0 (*)    RDW 16.6 (*)    Monocytes Absolute 1.1 (*)    All other components within normal limits  COMPREHENSIVE METABOLIC PANEL - Abnormal; Notable for the following components:   Potassium 3.1 (*)    Glucose, Bld 127 (*)    Calcium 8.6 (*)    AST 47 (*)    All other components within normal limits  PROCEDURES: Critical Care performed: No .1-3 Lead EKG Interpretation  Performed by: Naaman Plummer, MD Authorized by: Naaman Plummer, MD     Interpretation: normal     ECG rate:  97   ECG rate assessment: normal     Rhythm: sinus rhythm     Ectopy: none     Conduction: normal    MEDICATIONS ORDERED IN ED: Medications - No data  to display IMPRESSION / MDM / [place] / ED COURSE  I reviewed the triage vital signs and the nursing notes.                             The patient is on the cardiac monitor to evaluate for evidence of arrhythmia and/or significant heart rate changes. Patient's presentation is most consistent with acute presentation with potential threat to life or bodily function. Not Pregnant. Unlikely TOA, Ovarian Torsion, PID, gonorrhea/chlamydia. Low suspicion for Infected Urolithiasis, AAA, Cholecystitis, Pancreatitis, SBO, Appendicitis, or other acute abdomen. UA shows signs of of urinary tract infection Rx: Cefdinir 300 mg BID for 5 days Disposition: Discharge home. SRP discussed. Advise follow up with primary care provider within 24-72 hours.   FINAL CLINICAL IMPRESSION(S) / ED DIAGNOSES   Final diagnoses:  Lower urinary tract infectious disease  Fever, unspecified  fever cause   Rx / DC Orders   ED Discharge Orders          Ordered    cefdinir (OMNICEF) 300 MG capsule  2 times daily        08/01/21 2992           Note:  This document was prepared using Dragon voice recognition software and may include unintentional dictation errors.   Naaman Plummer, MD 08/01/21 [phone]

## 2021-08-01 NOTE — ED Triage Notes (Addendum)
Pt presents via POV with complaints of intermittent fevers for the last two days. No meds taking PTA - states her temp got as high as 101.9. She also has been experiencing frequent urination. Denies cough or nasal congestion.

## 2021-08-05 DIAGNOSIS — I10 Essential (primary) hypertension: Secondary | ICD-10-CM | POA: Diagnosis not present

## 2021-08-05 DIAGNOSIS — G44201 Tension-type headache, unspecified, intractable: Secondary | ICD-10-CM | POA: Diagnosis not present

## 2021-08-05 DIAGNOSIS — D649 Anemia, unspecified: Secondary | ICD-10-CM | POA: Diagnosis not present

## 2021-08-05 DIAGNOSIS — F411 Generalized anxiety disorder: Secondary | ICD-10-CM | POA: Diagnosis not present

## 2021-08-24 DIAGNOSIS — R21 Rash and other nonspecific skin eruption: Secondary | ICD-10-CM | POA: Diagnosis not present

## 2021-08-24 DIAGNOSIS — I10 Essential (primary) hypertension: Secondary | ICD-10-CM | POA: Diagnosis not present

## 2021-08-24 DIAGNOSIS — D649 Anemia, unspecified: Secondary | ICD-10-CM | POA: Diagnosis not present

## 2021-09-23 DIAGNOSIS — Z1389 Encounter for screening for other disorder: Secondary | ICD-10-CM | POA: Diagnosis not present

## 2021-09-23 DIAGNOSIS — M25551 Pain in right hip: Secondary | ICD-10-CM | POA: Diagnosis not present

## 2021-09-23 DIAGNOSIS — B372 Candidiasis of skin and nail: Secondary | ICD-10-CM | POA: Diagnosis not present

## 2021-09-23 DIAGNOSIS — F3132 Bipolar disorder, current episode depressed, moderate: Secondary | ICD-10-CM | POA: Diagnosis not present

## 2021-09-23 DIAGNOSIS — Z0131 Encounter for examination of blood pressure with abnormal findings: Secondary | ICD-10-CM | POA: Diagnosis not present

## 2021-10-01 DIAGNOSIS — Z0130 Encounter for examination of blood pressure without abnormal findings: Secondary | ICD-10-CM | POA: Diagnosis not present

## 2021-10-01 DIAGNOSIS — Z1389 Encounter for screening for other disorder: Secondary | ICD-10-CM | POA: Diagnosis not present

## 2021-10-01 DIAGNOSIS — Z0131 Encounter for examination of blood pressure with abnormal findings: Secondary | ICD-10-CM | POA: Diagnosis not present

## 2021-10-01 DIAGNOSIS — E669 Obesity, unspecified: Secondary | ICD-10-CM | POA: Diagnosis not present

## 2021-10-26 DIAGNOSIS — W57XXXA Bitten or stung by nonvenomous insect and other nonvenomous arthropods, initial encounter: Secondary | ICD-10-CM | POA: Diagnosis not present

## 2021-10-26 DIAGNOSIS — S30861A Insect bite (nonvenomous) of abdominal wall, initial encounter: Secondary | ICD-10-CM | POA: Diagnosis not present

## 2021-10-27 DIAGNOSIS — Z91030 Bee allergy status: Secondary | ICD-10-CM | POA: Diagnosis not present

## 2021-10-27 DIAGNOSIS — Z0131 Encounter for examination of blood pressure with abnormal findings: Secondary | ICD-10-CM | POA: Diagnosis not present

## 2021-10-27 DIAGNOSIS — Z1389 Encounter for screening for other disorder: Secondary | ICD-10-CM | POA: Diagnosis not present

## 2021-11-05 DIAGNOSIS — E669 Obesity, unspecified: Secondary | ICD-10-CM | POA: Diagnosis not present

## 2021-11-05 DIAGNOSIS — Z0130 Encounter for examination of blood pressure without abnormal findings: Secondary | ICD-10-CM | POA: Diagnosis not present

## 2021-11-05 DIAGNOSIS — Z1389 Encounter for screening for other disorder: Secondary | ICD-10-CM | POA: Diagnosis not present

## 2021-11-05 DIAGNOSIS — Z0131 Encounter for examination of blood pressure with abnormal findings: Secondary | ICD-10-CM | POA: Diagnosis not present

## 2021-11-05 DIAGNOSIS — F209 Schizophrenia, unspecified: Secondary | ICD-10-CM | POA: Diagnosis not present

## 2021-11-05 DIAGNOSIS — I10 Essential (primary) hypertension: Secondary | ICD-10-CM | POA: Diagnosis not present

## 2021-11-20 DIAGNOSIS — Z6834 Body mass index (BMI) 34.0-34.9, adult: Secondary | ICD-10-CM | POA: Diagnosis not present

## 2021-11-20 DIAGNOSIS — I10 Essential (primary) hypertension: Secondary | ICD-10-CM | POA: Diagnosis not present

## 2021-11-20 DIAGNOSIS — R5383 Other fatigue: Secondary | ICD-10-CM | POA: Diagnosis not present

## 2021-11-20 DIAGNOSIS — R0683 Snoring: Secondary | ICD-10-CM | POA: Diagnosis not present

## 2021-11-20 DIAGNOSIS — E6609 Other obesity due to excess calories: Secondary | ICD-10-CM | POA: Diagnosis not present

## 2021-12-09 DIAGNOSIS — M25561 Pain in right knee: Secondary | ICD-10-CM | POA: Diagnosis not present

## 2021-12-09 DIAGNOSIS — M1711 Unilateral primary osteoarthritis, right knee: Secondary | ICD-10-CM | POA: Diagnosis not present

## 2021-12-10 DIAGNOSIS — Z0131 Encounter for examination of blood pressure with abnormal findings: Secondary | ICD-10-CM | POA: Diagnosis not present

## 2021-12-10 DIAGNOSIS — F3132 Bipolar disorder, current episode depressed, moderate: Secondary | ICD-10-CM | POA: Diagnosis not present

## 2021-12-10 DIAGNOSIS — Z0130 Encounter for examination of blood pressure without abnormal findings: Secondary | ICD-10-CM | POA: Diagnosis not present

## 2021-12-10 DIAGNOSIS — R7309 Other abnormal glucose: Secondary | ICD-10-CM | POA: Diagnosis not present

## 2021-12-10 DIAGNOSIS — Z23 Encounter for immunization: Secondary | ICD-10-CM | POA: Diagnosis not present

## 2021-12-10 DIAGNOSIS — Z6841 Body Mass Index (BMI) 40.0 and over, adult: Secondary | ICD-10-CM | POA: Diagnosis not present

## 2021-12-10 DIAGNOSIS — Z1389 Encounter for screening for other disorder: Secondary | ICD-10-CM | POA: Diagnosis not present

## 2022-08-12 IMAGING — CR DG HIP (WITH OR WITHOUT PELVIS) 2-3V*R*
3 series · 3 of 3 positions shown · non-contrast
Comparison: September 12, 2019.

CLINICAL DATA: Hip pain, RIGHT leg pain for several months.

EXAM:
DG HIP (WITH OR WITHOUT PELVIS) 2-3V RIGHT

[pelvis ap]
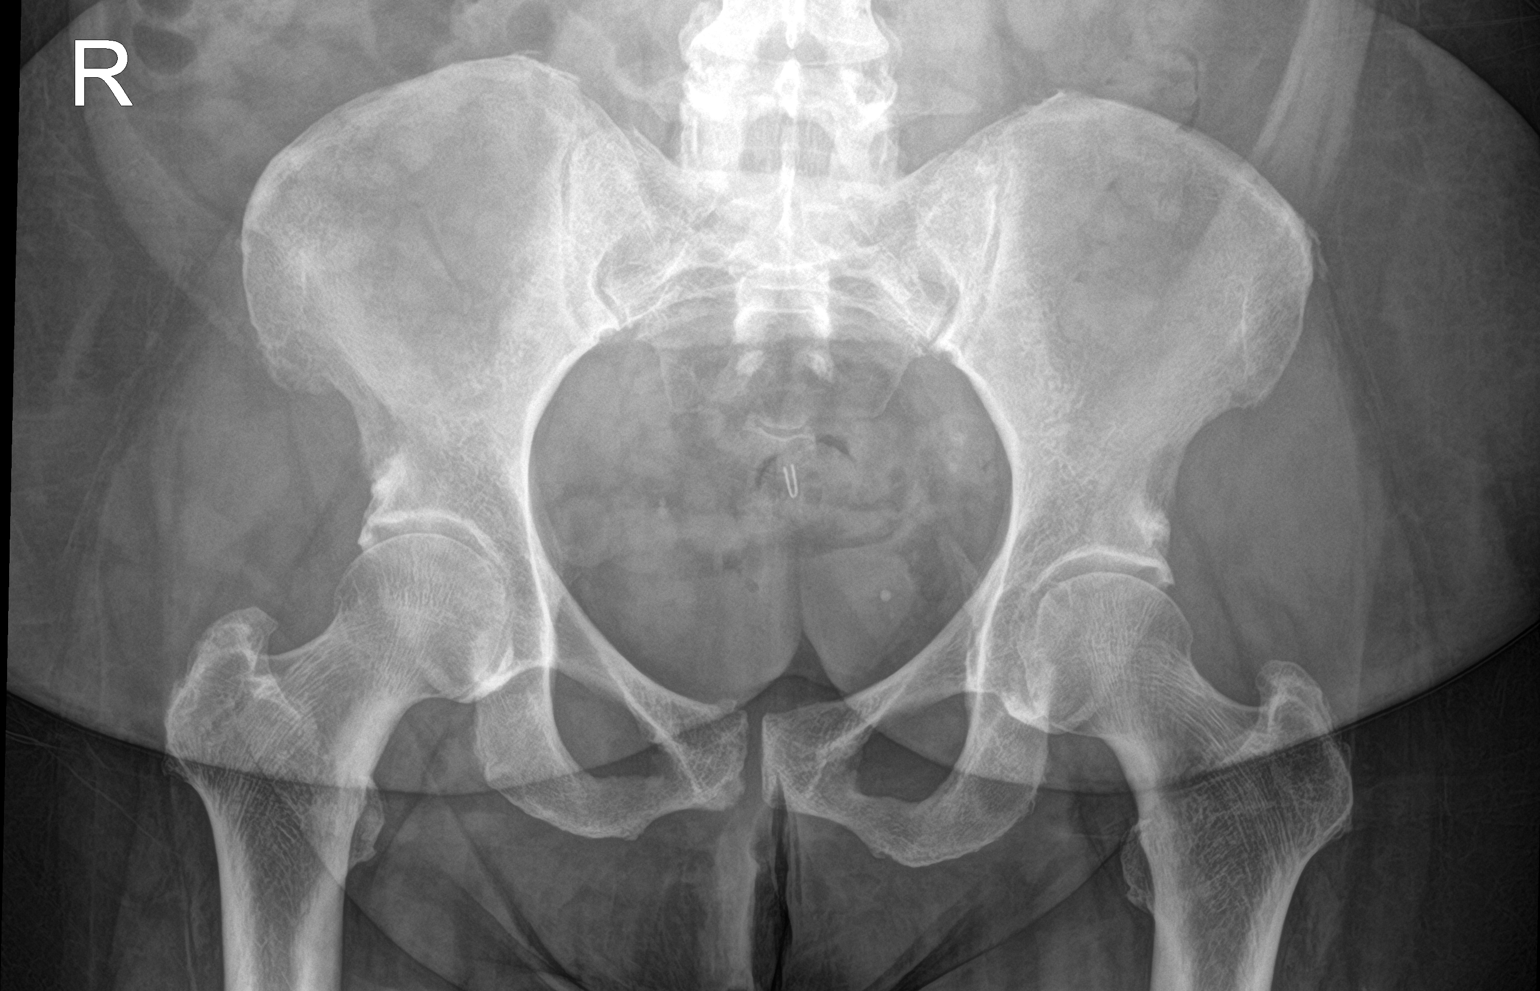

[hip ap]
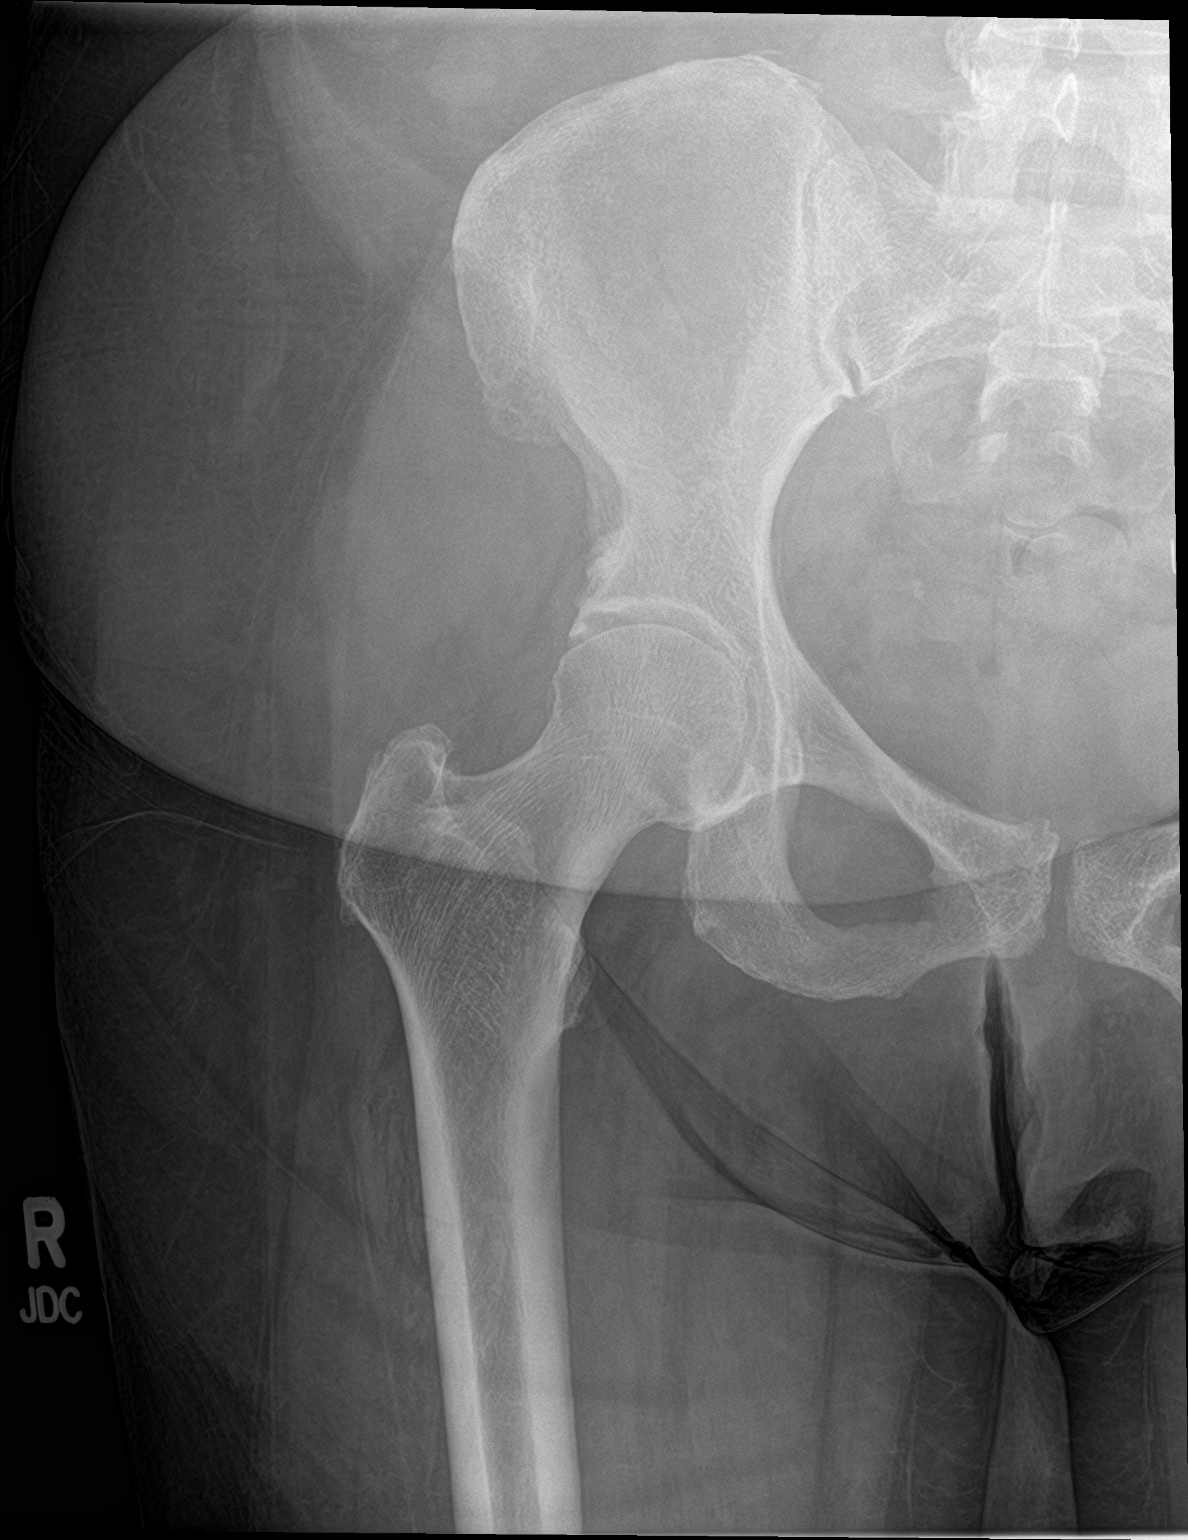

[hip lat]
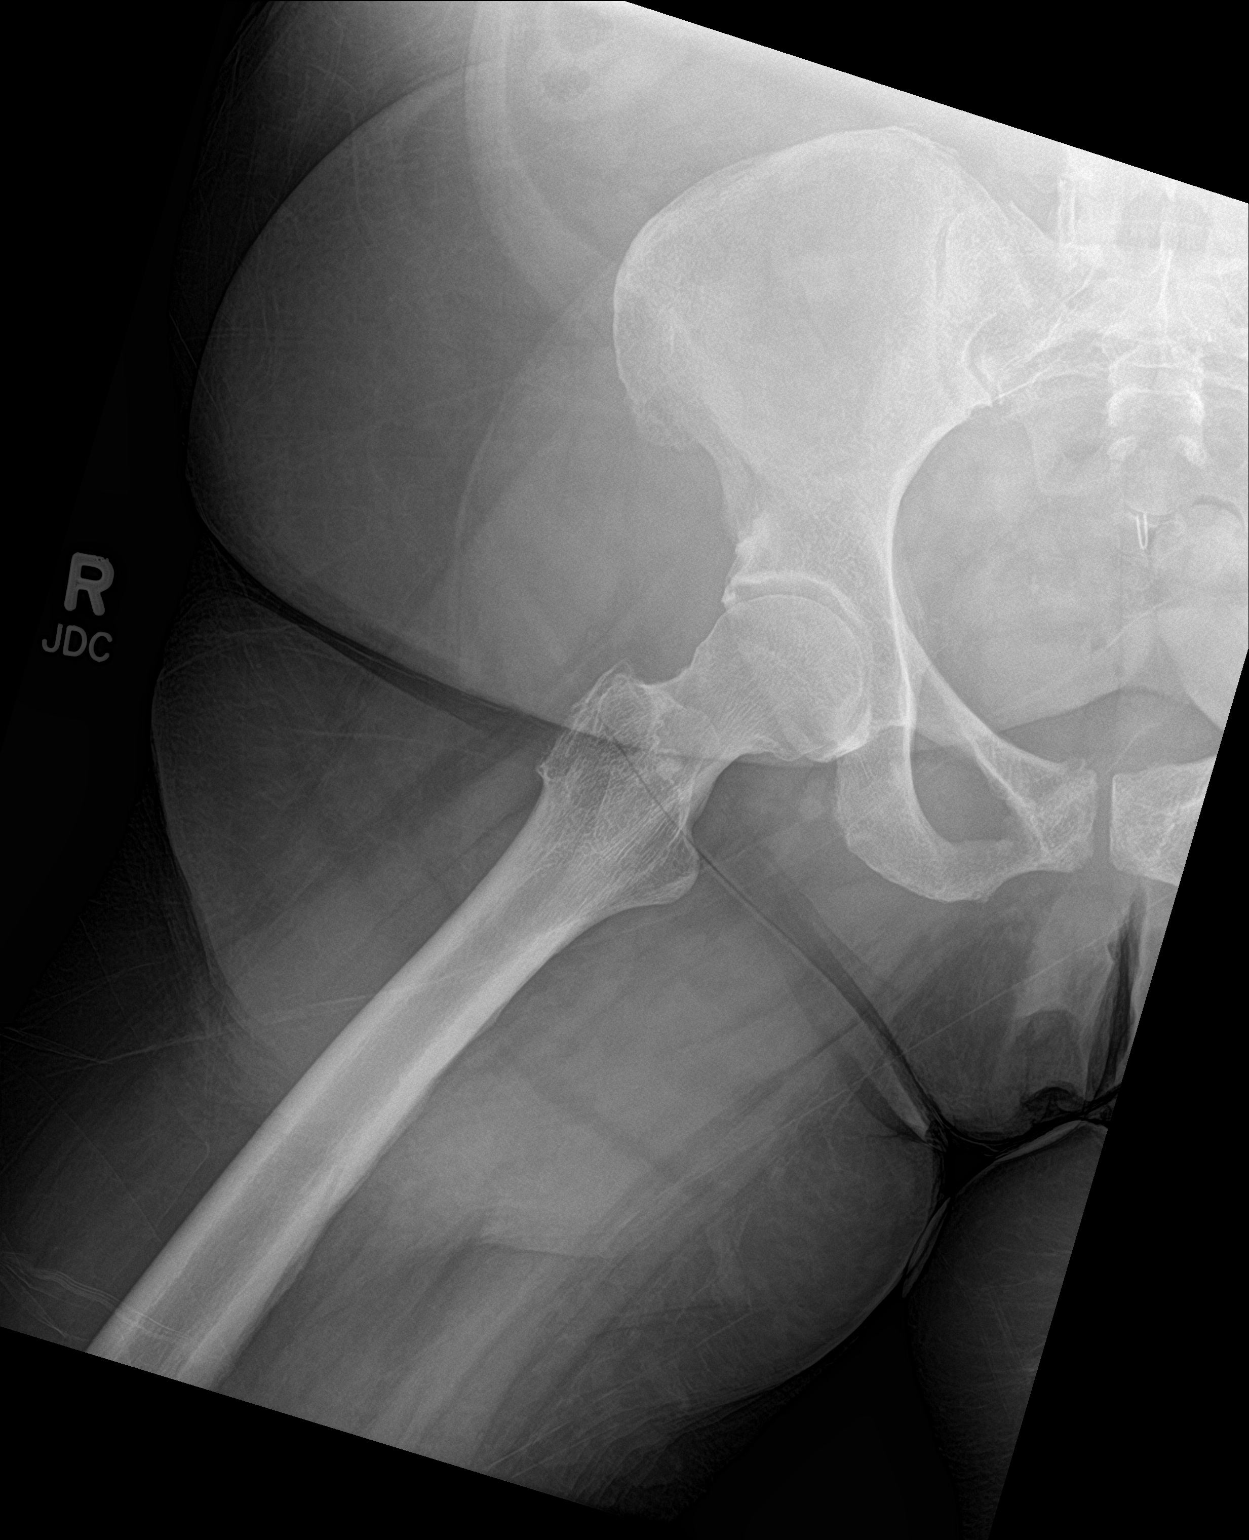

[3 of 3 positions shown; findings below may reference images not displayed]

FINDINGS: No sign of fracture about the bony pelvis. RIGHT hip is located
without signs of fracture. Degenerative changes are noted about the
bilateral hips.
IMPRESSION: Degenerative changes about the bilateral hips. No acute findings.

## 2023-05-05 ENCOUNTER — Ambulatory Visit (INDEPENDENT_AMBULATORY_CARE_PROVIDER_SITE_OTHER): Admitting: Internal Medicine

## 2023-05-05 ENCOUNTER — Encounter: Payer: Self-pay | Admitting: Internal Medicine

## 2023-05-05 VITALS — BP 142/92 | HR 97 | Ht 61.0 in | Wt 190.0 lb

## 2023-05-05 DIAGNOSIS — R7303 Prediabetes: Secondary | ICD-10-CM | POA: Insufficient documentation

## 2023-05-05 DIAGNOSIS — E782 Mixed hyperlipidemia: Secondary | ICD-10-CM | POA: Diagnosis not present

## 2023-05-05 DIAGNOSIS — G43009 Migraine without aura, not intractable, without status migrainosus: Secondary | ICD-10-CM | POA: Diagnosis not present

## 2023-05-05 DIAGNOSIS — E559 Vitamin D deficiency, unspecified: Secondary | ICD-10-CM

## 2023-05-05 DIAGNOSIS — I10 Essential (primary) hypertension: Secondary | ICD-10-CM | POA: Insufficient documentation

## 2023-05-05 DIAGNOSIS — Z1389 Encounter for screening for other disorder: Secondary | ICD-10-CM | POA: Insufficient documentation

## 2023-05-05 DIAGNOSIS — F411 Generalized anxiety disorder: Secondary | ICD-10-CM | POA: Diagnosis not present

## 2023-05-05 DIAGNOSIS — R5383 Other fatigue: Secondary | ICD-10-CM

## 2023-05-05 LAB — POCT URINALYSIS DIPSTICK
Bilirubin, UA: NEGATIVE
Blood, UA: NEGATIVE
Glucose, UA: NEGATIVE
Ketones, UA: NEGATIVE
Leukocytes, UA: NEGATIVE
Nitrite, UA: NEGATIVE
Protein, UA: NEGATIVE
Spec Grav, UA: 1.015 (ref 1.010–1.025)
Urobilinogen, UA: 0.2 U/dL
pH, UA: 5.5 (ref 5.0–8.0)

## 2023-05-05 MED ORDER — RIZATRIPTAN BENZOATE 10 MG PO TBDP: 10.0000 mg | ORAL_TABLET | ORAL | 3 refills | Status: AC | PRN

## 2023-05-05 NOTE — Progress Notes (Signed)
 New Patient Office Visit  Subjective   Patient ID: Elizabeth Gay, female    DOB: Feb 20, 1960  Age: 63 y.o. MRN: [id number]  CC:  Chief Complaint  Patient presents with   Establish Care    NPE    HPI Elizabeth Gay presents to establish care Previous Primary Care provider/office:   she does have additional concerns to discuss today.   Patient is here to set up primary care.  She is under care of psychiatry for major depression and is taking her medications as prescribed.  However her blood pressure is high today and she has been complaining of headaches for the last 2 weeks.  Patient reports that she used to be on a blood pressure medicine but stopped several years ago.  She also has history of migraines and used to take Maxalt tablets but she is no longer has them.  Will send in a prescription for Maxalt and patient advised to avoid salt.  Will repeat her blood pressure and consider starting medications.  Patient is also due for blood work.  Her last mammogram was in July 2024. Her colonoscopy was in 2019-she had colon polyps but they were negative for dysplasia or malignancy. She is on permanent disability due to her major depression.    Outpatient Encounter Medications as of 05/05/2023  Medication Sig   ALPRAZolam (XANAX) 1 MG tablet Take 1 mg by mouth as needed.   buPROPion (WELLBUTRIN) 100 MG tablet Take 1 tablet by mouth as needed.   clonazePAM (KLONOPIN) 0.5 MG tablet Take 1 tablet by mouth as needed.   docusate sodium (COLACE) 100 MG capsule Take 100 mg by mouth 2 (two) times daily as needed for mild constipation.    meclizine (ANTIVERT) 25 MG tablet Take 1 tablet (25 mg total) by mouth 3 (three) times daily as needed for dizziness or nausea.   QUEtiapine (SEROQUEL) 300 MG tablet Take 1 tablet by mouth as needed.   rizatriptan (MAXALT-MLT) 10 MG disintegrating tablet Take 1 tablet (10 mg total) by mouth as needed for migraine. May repeat in 2 hours if needed    sertraline (ZOLOFT) 100 MG tablet Take 1 tablet by mouth as needed.   metoprolol succinate (TOPROL-XL) 25 MG 24 hr tablet Take 1 tablet by mouth daily. (Patient not taking: Reported on 05/05/2023)   No facility-administered encounter medications on file as of 05/05/2023.    Past Medical History:  Diagnosis Date   Allergy    Anxiety    Arthritis    Bipolar disorder (HCC)    Depression    Dyspnea    GERD (gastroesophageal reflux disease)    Headache    History of kidney stones    Hypertension    Pre-diabetes    Sickle cell anemia (HCC)     Past Surgical History:  Procedure Laterality Date   ABDOMINAL HYSTERECTOMY     partial   CESAREAN SECTION     x5   COLONOSCOPY WITH PROPOFOL N/A 12/16/2017   Procedure: COLONOSCOPY WITH PROPOFOL;  Surgeon: Wyline Mood, MD;  Location: [facility];  Service: Gastroenterology;  Laterality: N/A;   KNEE ARTHROSCOPY Left 04/07/2017   Procedure: ARTHROSCOPY KNEE WITH LOOSE BODY REMOVAL;  Surgeon: Kennedy Bucker, MD;  Location: [facility] ORS;  Service: Orthopedics;  Laterality: Left;    Family History  Problem Relation Age of Onset   Diabetes Mother    Throat cancer Mother    Hypertension Sister    Healthy Son    Healthy Son  Healthy Son    Healthy Son    Healthy Son     Social History   Socioeconomic History   Marital status: Single    Spouse name: Not on file   Number of children: 5   Years of education: Not on file   Highest education level: Not on file  Occupational History   Not on file  Tobacco Use   Smoking status: Never   Smokeless tobacco: Never  Vaping Use   Vaping status: Never Used  Substance and Sexual Activity   Alcohol use: No   Drug use: No   Sexual activity: Not on file  Other Topics Concern   Not on file  Social History Narrative   Not on file   Social Drivers of Health   Financial Resource Strain: Low Risk  (02/17/2023)   Received from [facility]   Overall Financial Resource Strain  (CARDIA)    Difficulty of Paying Living Expenses: Not hard at all  Food Insecurity: No Food Insecurity (02/17/2023)   Received from [facility]   Hunger Vital Sign    Worried About Running Out of Food in the Last Year: Never true    Ran Out of Food in the Last Year: Never true  Transportation Needs: No Transportation Needs (02/17/2023)   Received from [facility] Transportation    In the past 12 months, has lack of transportation kept you from medical appointments or from getting medications?: No    Lack of Transportation (Non-Medical): No  Physical Activity: Not on file  Stress: Not on file  Social Connections: Not on file  Intimate Partner Violence: Not on file    Review of Systems  Constitutional: Negative.  Negative for chills, fever and malaise/fatigue.  HENT:  Negative for hearing loss, sinus pain and sore throat.   Eyes: Negative.   Respiratory: Negative.  Negative for cough and shortness of breath.   Cardiovascular: Negative.  Negative for chest pain, palpitations and leg swelling.  Gastrointestinal: Negative.  Negative for abdominal pain, constipation, diarrhea, heartburn, nausea and vomiting.  Genitourinary: Negative.  Negative for dysuria and flank pain.  Musculoskeletal: Negative.  Negative for joint pain and myalgias.  Skin: Negative.   Neurological:  Positive for headaches. Negative for dizziness, tingling, tremors and sensory change.  Endo/Heme/Allergies: Negative.   Psychiatric/Behavioral: Negative.  Negative for depression and suicidal ideas. The patient is not nervous/anxious.         Objective   BP (!) 142/92   Pulse 97   Ht 5\' 1"  (1.549 m)   Wt 190 lb (86.2 kg)   SpO2 98%   BMI 35.90 kg/m   Physical Exam Vitals and nursing note reviewed.  Constitutional:      Appearance: Normal appearance.  HENT:     Head: Normocephalic and atraumatic.     Nose: Nose normal.     Mouth/Throat:     Mouth: Mucous  membranes are moist.     Pharynx: Oropharynx is clear.  Eyes:     Conjunctiva/sclera: Conjunctivae normal.     Pupils: Pupils are equal, round, and reactive to light.  Cardiovascular:     Rate and Rhythm: Normal rate and regular rhythm.     Pulses: Normal pulses.     Heart sounds: Normal heart sounds. No murmur heard. Pulmonary:     Effort: Pulmonary effort is normal.     Breath sounds: Normal breath sounds. No wheezing.  Abdominal:  General: Bowel sounds are normal.     Palpations: Abdomen is soft.     Tenderness: There is no abdominal tenderness. There is no right CVA tenderness or left CVA tenderness.  Musculoskeletal:        General: Normal range of motion.     Cervical back: Normal range of motion.     Right lower leg: No edema.     Left lower leg: No edema.  Skin:    General: Skin is warm and dry.  Neurological:     General: No focal deficit present.     Mental Status: She is alert and oriented to person, place, and time.  Psychiatric:        Mood and Affect: Mood normal.        Behavior: Behavior normal.        Assessment & Plan:  Check blood work today.  Monitor blood pressure.  Start Maxalt prescription for migraines as needed.  Patient to return in 1 week to discuss her lab results. Problem List Items Addressed This Visit     Migraine without aura and without status migrainosus, not intractable   Relevant Medications   rizatriptan (MAXALT-MLT) 10 MG disintegrating tablet   Mixed hyperlipidemia   Relevant Orders   Lipid Profile   GAD (generalized anxiety disorder)   Essential hypertension, benign - Primary   Relevant Orders   CMP14+EGFR   Prediabetes   Relevant Orders   Hemoglobin A1c   Other fatigue   Relevant Orders   CBC with Diff   TSH   Screening for blood or protein in urine   Relevant Orders   POCT Urinalysis Dipstick (16109) (Completed)   Vitamin D deficiency   Relevant Orders   Vitamin D (25 hydroxy)    Return in about 1 week (around  05/12/2023).   Total time spent: 30 minutes  Margaretann Loveless, MD  05/05/2023   This document may have been prepared by [employer] Voice Recognition software and as such may include unintentional dictation errors.

## 2023-05-06 ENCOUNTER — Other Ambulatory Visit: Payer: Self-pay | Admitting: Internal Medicine

## 2023-05-06 DIAGNOSIS — E876 Hypokalemia: Secondary | ICD-10-CM

## 2023-05-06 DIAGNOSIS — E559 Vitamin D deficiency, unspecified: Secondary | ICD-10-CM

## 2023-05-06 LAB — CBC WITH DIFFERENTIAL/PLATELET
Basophils Absolute: 0 10*3/uL (ref 0.0–0.2)
Basos: 0 %
EOS (ABSOLUTE): 0.3 10*3/uL (ref 0.0–0.4)
Eos: 3 %
Hematocrit: 35.1 % (ref 34.0–46.6)
Hemoglobin: 11.1 g/dL (ref 11.1–15.9)
Immature Grans (Abs): 0.1 10*3/uL (ref 0.0–0.1)
Immature Granulocytes: 1 %
Lymphocytes Absolute: 3 10*3/uL (ref 0.7–3.1)
Lymphs: 31 %
MCH: 24.6 pg — ABNORMAL LOW (ref 26.6–33.0)
MCHC: 31.6 g/dL (ref 31.5–35.7)
MCV: 78 fL — ABNORMAL LOW (ref 79–97)
Monocytes Absolute: 0.7 10*3/uL (ref 0.1–0.9)
Monocytes: 7 %
Neutrophils Absolute: 5.6 10*3/uL (ref 1.4–7.0)
Neutrophils: 58 %
Platelets: 326 10*3/uL (ref 150–450)
RBC: 4.51 x10E6/uL (ref 3.77–5.28)
RDW: 17.1 % — ABNORMAL HIGH (ref 11.7–15.4)
WBC: 9.7 10*3/uL (ref 3.4–10.8)

## 2023-05-06 LAB — CMP14+EGFR
ALT: 19 IU/L (ref 0–32)
AST: 27 IU/L (ref 0–40)
Albumin: 4.1 g/dL (ref 3.9–4.9)
Alkaline Phosphatase: 137 IU/L — ABNORMAL HIGH (ref 44–121)
BUN/Creatinine Ratio: 10 — ABNORMAL LOW (ref 12–28)
BUN: 9 mg/dL (ref 8–27)
Bilirubin Total: 0.2 mg/dL (ref 0.0–1.2)
CO2: 25 mmol/L (ref 20–29)
Calcium: 9.3 mg/dL (ref 8.7–10.3)
Chloride: 107 mmol/L — ABNORMAL HIGH (ref 96–106)
Creatinine, Ser: 0.86 mg/dL (ref 0.57–1.00)
Globulin, Total: 3 g/dL (ref 1.5–4.5)
Glucose: 79 mg/dL (ref 70–99)
Potassium: 3.4 mmol/L — ABNORMAL LOW (ref 3.5–5.2)
Sodium: 144 mmol/L (ref 134–144)
Total Protein: 7.1 g/dL (ref 6.0–8.5)
eGFR: 76 mL/min/{1.73_m2} (ref >59)

## 2023-05-06 LAB — LIPID PANEL
Chol/HDL Ratio: 3 ratio (ref 0.0–4.4)
Cholesterol, Total: 166 mg/dL (ref 100–199)
HDL: 55 mg/dL (ref >39)
LDL Chol Calc (NIH): 90 mg/dL (ref 0–99)
Triglycerides: 120 mg/dL (ref 0–149)
VLDL Cholesterol Cal: 21 mg/dL (ref 5–40)

## 2023-05-06 LAB — TSH: TSH: 1.04 u[IU]/mL (ref 0.450–4.500)

## 2023-05-06 LAB — HEMOGLOBIN A1C
Est. average glucose Bld gHb Est-mCnc: 128 mg/dL
Hgb A1c MFr Bld: 6.1 % — ABNORMAL HIGH (ref 4.8–5.6)

## 2023-05-06 LAB — VITAMIN D 25 HYDROXY (VIT D DEFICIENCY, FRACTURES): Vit D, 25-Hydroxy: 20.9 ng/mL — ABNORMAL LOW (ref 30.0–100.0)

## 2023-05-06 MED ORDER — POTASSIUM CHLORIDE CRYS ER 10 MEQ PO TBCR: 20.0000 meq | EXTENDED_RELEASE_TABLET | Freq: Two times a day (BID) | ORAL | 0 refills | Status: AC

## 2023-05-06 MED ORDER — VITAMIN D3 1.25 MG (50000 UT) PO CAPS: 1.0000 | ORAL_CAPSULE | ORAL | 3 refills | Status: AC

## 2023-05-10 NOTE — Progress Notes (Signed)
 Patient notified

## 2023-05-12 ENCOUNTER — Ambulatory Visit (INDEPENDENT_AMBULATORY_CARE_PROVIDER_SITE_OTHER): Admitting: Internal Medicine

## 2023-05-12 ENCOUNTER — Encounter: Payer: Self-pay | Admitting: Internal Medicine

## 2023-05-12 VITALS — BP 132/70 | HR 83 | Ht 61.0 in | Wt 188.0 lb

## 2023-05-12 DIAGNOSIS — G43009 Migraine without aura, not intractable, without status migrainosus: Secondary | ICD-10-CM | POA: Diagnosis not present

## 2023-05-12 DIAGNOSIS — E559 Vitamin D deficiency, unspecified: Secondary | ICD-10-CM

## 2023-05-12 DIAGNOSIS — E876 Hypokalemia: Secondary | ICD-10-CM

## 2023-05-12 DIAGNOSIS — R7303 Prediabetes: Secondary | ICD-10-CM

## 2023-05-12 DIAGNOSIS — I10 Essential (primary) hypertension: Secondary | ICD-10-CM | POA: Diagnosis not present

## 2023-05-12 DIAGNOSIS — E782 Mixed hyperlipidemia: Secondary | ICD-10-CM

## 2023-05-12 MED ORDER — MOUNJARO 2.5 MG/0.5ML ~~LOC~~ SOAJ: 2.5000 mg | SUBCUTANEOUS | 0 refills | Status: DC

## 2023-05-12 MED ORDER — METOPROLOL SUCCINATE ER 25 MG PO TB24: 25.0000 mg | ORAL_TABLET | Freq: Every day | ORAL | 3 refills | Status: AC

## 2023-05-12 NOTE — Progress Notes (Signed)
 Established Patient Office Visit  Subjective:  Patient ID: Elizabeth Gay, female    DOB: [birth year]  Age: 63 y.o. MRN: [id number]  Chief Complaint  Patient presents with   Follow-up    1 week follow up    Patient comes in for her follow-up today.  Her blood pressure is looking better.  At her last labs her potassium was found to be very low, and replacement was sent.  Will repeat potassium level today.  Vitamin D levels were also very low and now she is taking a supplement once a week.  Will check that in 3 months.  Patient says she still having migraine headache, did not take her Maxalt properly.  Agrees to take it sublingual, can also pick up Excedrin.  Patient is currently on Zoloft but is not helping with migraine prophylaxis.  She also has a prescription for metoprolol but is not taking it as prescribed.  Patient will start taking it regularly.  Will consider Topamax if not better. Patient has been trying to lose weight with diet and exercise alone.  Previously had used Ozempic but it stopped working shortly.  Will send in a prescription for Mounjaro starting at 2.5 mg/week.    No other concerns at this time.   Past Medical History:  Diagnosis Date   Allergy    Anxiety    Arthritis    Bipolar disorder (HCC)    Depression    Dyspnea    GERD (gastroesophageal reflux disease)    Headache    History of kidney stones    Hypertension    Pre-diabetes    Sickle cell anemia (HCC)     Past Surgical History:  Procedure Laterality Date   ABDOMINAL HYSTERECTOMY     partial   CESAREAN SECTION     x5   COLONOSCOPY WITH PROPOFOL N/A 12/16/2017   Procedure: COLONOSCOPY WITH PROPOFOL;  Surgeon: Wyline Mood, MD;  Location: [facility] ENDOSCOPY;  Service: Gastroenterology;  Laterality: N/A;   KNEE ARTHROSCOPY Left 04/07/2017   Procedure: ARTHROSCOPY KNEE WITH LOOSE BODY REMOVAL;  Surgeon: Kennedy Bucker, MD;  Location: [facility] ORS;  Service: Orthopedics;  Laterality: Left;    Social  History   Socioeconomic History   Marital status: Single    Spouse name: Not on file   Number of children: 5   Years of education: Not on file   Highest education level: Not on file  Occupational History   Not on file  Tobacco Use   Smoking status: Never   Smokeless tobacco: Never  Vaping Use   Vaping status: Never Used  Substance and Sexual Activity   Alcohol use: No   Drug use: No   Sexual activity: Not on file  Other Topics Concern   Not on file  Social History Narrative   Not on file   Social Drivers of Health   Financial Resource Strain: Low Risk  (02/17/2023)   Received from [facility]   Overall Financial Resource Strain (CARDIA)    Difficulty of Paying Living Expenses: Not hard at all  Food Insecurity: No Food Insecurity (02/17/2023)   Received from [facility]   Hunger Vital Sign    Worried About Running Out of Food in the Last Year: Never true    Ran Out of Food in the Last Year: Never true  Transportation Needs: No Transportation Needs (02/17/2023)   Received from [facility] - Transportation    In the  past 12 months, has lack of transportation kept you from medical appointments or from getting medications?: No    Lack of Transportation (Non-Medical): No  Physical Activity: Not on file  Stress: Not on file  Social Connections: Not on file  Intimate Partner Violence: Not on file    Family History  Problem Relation Age of Onset   Diabetes Mother    Throat cancer Mother    Hypertension Sister    Healthy Son    Healthy Son    Healthy Son    Healthy Son    Healthy Son     Allergies  Allergen Reactions   Tetanus Toxoid Nausea Only    Patient stated she is NOT allergic to tetanus Patient stated she is NOT allergic to tetanus     Outpatient Medications Prior to Visit  Medication Sig   ALPRAZolam (XANAX) 1 MG tablet Take 1 mg by mouth as needed.   buPROPion (WELLBUTRIN) 100 MG tablet Take  1 tablet by mouth as needed.   Cholecalciferol (VITAMIN D3) 1.25 MG (50000 UT) CAPS Take 1 capsule (1.25 mg total) by mouth once a week.   clonazePAM (KLONOPIN) 0.5 MG tablet Take 1 tablet by mouth as needed.   docusate sodium (COLACE) 100 MG capsule Take 100 mg by mouth 2 (two) times daily as needed for mild constipation.    meclizine (ANTIVERT) 25 MG tablet Take 1 tablet (25 mg total) by mouth 3 (three) times daily as needed for dizziness or nausea.   potassium chloride (KLOR-CON M) 10 MEQ tablet Take 2 tablets (20 mEq total) by mouth 2 (two) times daily for 3 days.   QUEtiapine (SEROQUEL) 300 MG tablet Take 1 tablet by mouth as needed.   rizatriptan (MAXALT-MLT) 10 MG disintegrating tablet Take 1 tablet (10 mg total) by mouth as needed for migraine. May repeat in 2 hours if needed   sertraline (ZOLOFT) 100 MG tablet Take 1 tablet by mouth as needed.   [DISCONTINUED] metoprolol succinate (TOPROL-XL) 25 MG 24 hr tablet Take 1 tablet by mouth daily. (Patient not taking: Reported on 05/05/2023)   No facility-administered medications prior to visit.    Review of Systems  Constitutional: Negative.  Negative for chills, diaphoresis, fever, malaise/fatigue and weight loss.  HENT: Negative.  Negative for sinus pain and sore throat.   Eyes: Negative.   Respiratory: Negative.  Negative for cough and shortness of breath.   Cardiovascular: Negative.  Negative for chest pain, palpitations and leg swelling.  Gastrointestinal: Negative.  Negative for abdominal pain, constipation, diarrhea, heartburn, nausea and vomiting.  Genitourinary: Negative.  Negative for dysuria and flank pain.  Musculoskeletal: Negative.  Negative for joint pain and myalgias.  Skin: Negative.   Neurological:  Positive for headaches. Negative for dizziness.  Endo/Heme/Allergies: Negative.   Psychiatric/Behavioral: Negative.  Negative for depression and suicidal ideas. The patient is not nervous/anxious.        Objective:    BP 132/70   Pulse 83   Ht 5\' 1"  (1.549 m)   Wt 188 lb (85.3 kg)   SpO2 97%   BMI 35.52 kg/m   Vitals:   05/12/23 1301  BP: 132/70  Pulse: 83  Height: 5\' 1"  (1.549 m)  Weight: 188 lb (85.3 kg)  SpO2: 97%  BMI (Calculated): 35.54    Physical Exam Vitals and nursing note reviewed.  Constitutional:      Appearance: Normal appearance.  HENT:     Head: Normocephalic and atraumatic.     Nose: Nose  normal.     Mouth/Throat:     Mouth: Mucous membranes are moist.     Pharynx: Oropharynx is clear.  Eyes:     Conjunctiva/sclera: Conjunctivae normal.     Pupils: Pupils are equal, round, and reactive to light.  Cardiovascular:     Rate and Rhythm: Normal rate and regular rhythm.     Pulses: Normal pulses.     Heart sounds: Normal heart sounds. No murmur heard. Pulmonary:     Effort: Pulmonary effort is normal.     Breath sounds: Normal breath sounds. No wheezing.  Abdominal:     General: Bowel sounds are normal.     Palpations: Abdomen is soft.     Tenderness: There is no abdominal tenderness. There is no right CVA tenderness or left CVA tenderness.  Musculoskeletal:        General: Normal range of motion.     Cervical back: Normal range of motion.     Right lower leg: No edema.     Left lower leg: No edema.  Skin:    General: Skin is warm and dry.  Neurological:     General: No focal deficit present.     Mental Status: She is alert and oriented to person, place, and time.  Psychiatric:        Mood and Affect: Mood normal.        Behavior: Behavior normal.      No results found for any visits on 05/12/23.  Recent Results (from the past 2160 hours)  POCT Urinalysis Dipstick (16109)     Status: None   Collection Time: 05/05/23  1:58 PM  Result Value Ref Range   Color, UA Yellow    Clarity, UA Clear    Glucose, UA Negative Negative   Bilirubin, UA Negative    Ketones, UA Negative    Spec Grav, UA 1.015 1.010 - 1.025   Blood, UA Negative    pH, UA 5.5 5.0 -  8.0   Protein, UA Negative Negative   Urobilinogen, UA 0.2 0.2 or 1.0 E.U./dL   Nitrite, UA Negative    Leukocytes, UA Negative Negative   Appearance Clear    Odor No   CBC with Diff     Status: Abnormal   Collection Time: 05/05/23  2:08 PM  Result Value Ref Range   WBC 9.7 3.4 - 10.8 x10E3/uL   RBC 4.51 3.77 - 5.28 x10E6/uL   Hemoglobin 11.1 11.1 - 15.9 g/dL   Hematocrit 60.4 54.0 - 46.6 %   MCV 78 (L) 79 - 97 fL   MCH 24.6 (L) 26.6 - 33.0 pg   MCHC 31.6 31.5 - 35.7 g/dL   RDW 98.1 (H) 19.1 - 47.8 %   Platelets 326 150 - 450 x10E3/uL   Neutrophils 58 Not Estab. %   Lymphs 31 Not Estab. %   Monocytes 7 Not Estab. %   Eos 3 Not Estab. %   Basos 0 Not Estab. %   Neutrophils Absolute 5.6 1.4 - 7.0 x10E3/uL   Lymphocytes Absolute 3.0 0.7 - 3.1 x10E3/uL   Monocytes Absolute 0.7 0.1 - 0.9 x10E3/uL   EOS (ABSOLUTE) 0.3 0.0 - 0.4 x10E3/uL   Basophils Absolute 0.0 0.0 - 0.2 x10E3/uL   Immature Granulocytes 1 Not Estab. %   Immature Grans (Abs) 0.1 0.0 - 0.1 x10E3/uL  CMP14+EGFR     Status: Abnormal   Collection Time: 05/05/23  2:08 PM  Result Value Ref Range   Glucose 79 70 -  99 mg/dL   BUN 9 8 - 27 mg/dL   Creatinine, Ser 5.78 0.57 - 1.00 mg/dL   eGFR 76 >46 NG/EXB/2.84   BUN/Creatinine Ratio 10 (L) 12 - 28   Sodium 144 134 - 144 mmol/L   Potassium 3.4 (L) 3.5 - 5.2 mmol/L   Chloride 107 (H) 96 - 106 mmol/L   CO2 25 20 - 29 mmol/L   Calcium 9.3 8.7 - 10.3 mg/dL   Total Protein 7.1 6.0 - 8.5 g/dL   Albumin 4.1 3.9 - 4.9 g/dL   Globulin, Total 3.0 1.5 - 4.5 g/dL   Bilirubin Total 0.2 0.0 - 1.2 mg/dL   Alkaline Phosphatase 137 (H) 44 - 121 IU/L   AST 27 0 - 40 IU/L   ALT 19 0 - 32 IU/L  TSH     Status: None   Collection Time: 05/05/23  2:08 PM  Result Value Ref Range   TSH 1.040 0.450 - 4.500 uIU/mL  Hemoglobin A1c     Status: Abnormal   Collection Time: 05/05/23  2:08 PM  Result Value Ref Range   Hgb A1c MFr Bld 6.1 (H) 4.8 - 5.6 %    Comment:          Prediabetes:  5.7 - 6.4          Diabetes: >6.4          Glycemic control for adults with diabetes: <7.0    Est. average glucose Bld gHb Est-mCnc 128 mg/dL  Lipid Profile     Status: None   Collection Time: 05/05/23  2:08 PM  Result Value Ref Range   Cholesterol, Total 166 100 - 199 mg/dL   Triglycerides 132 0 - 149 mg/dL   HDL 55 >44 mg/dL   VLDL Cholesterol Cal 21 5 - 40 mg/dL   LDL Chol Calc (NIH) 90 0 - 99 mg/dL   Chol/HDL Ratio 3.0 0.0 - 4.4 ratio    Comment:                                   T. Chol/HDL Ratio                                             Men  Women                               1/2 Avg.Risk  3.4    3.3                                   Avg.Risk  5.0    4.4                                2X Avg.Risk  9.6    7.1                                3X Avg.Risk 23.4   11.0   Vitamin D (25 hydroxy)     Status: Abnormal   Collection Time: 05/05/23  2:08 PM  Result Value Ref Range  Vit D, 25-Hydroxy 20.9 (L) 30.0 - 100.0 ng/mL    Comment: Vitamin D deficiency has been defined by the Institute of Medicine and an Endocrine Society practice guideline as a level of serum 25-OH vitamin D less than 20 ng/mL (1,2). The Endocrine Society went on to further define vitamin D insufficiency as a level between 21 and 29 ng/mL (2). 1. IOM (Institute of Medicine). 2010. Dietary reference    intakes for calcium and D. [place]: The    [employer]. 2. Holick MF, [place], Bischoff-Ferrari HA, et al.    Evaluation, treatment, and prevention of vitamin D    deficiency: an Endocrine Society clinical practice    guideline. JCEM. 2011 Jul; 96(7):1911-30.       Assessment & Plan:  Repeat potassium today.  Resume metoprolol daily.  Continue Maxalt and Excedrin Migraine.  Mounjaro.  Strict diet control emphasized. Problem List Items Addressed This Visit     Migraine without aura and without status migrainosus, not intractable   Relevant Medications   metoprolol succinate  (TOPROL-XL) 25 MG 24 hr tablet   Mixed hyperlipidemia   Relevant Medications   metoprolol succinate (TOPROL-XL) 25 MG 24 hr tablet   Essential hypertension, benign - Primary   Relevant Medications   metoprolol succinate (TOPROL-XL) 25 MG 24 hr tablet   Prediabetes   Relevant Medications   tirzepatide (MOUNJARO) 2.5 MG/0.5ML Pen   Vitamin D deficiency   Other Visit Diagnoses       Hypokalemia       Relevant Orders   Potassium       Return in about 3 weeks (around 06/02/2023).   Total time spent: 30 minutes  Margaretann Loveless, MD  05/12/2023   This document may have been prepared by [facility] Voice Recognition software and as such may include unintentional dictation errors.

## 2023-05-13 LAB — POTASSIUM: Potassium: 4.1 mmol/L (ref 3.5–5.2)

## 2023-05-18 NOTE — Progress Notes (Signed)
 Patient notified

## 2023-06-01 ENCOUNTER — Other Ambulatory Visit: Payer: Self-pay | Admitting: Internal Medicine

## 2023-06-01 DIAGNOSIS — R7303 Prediabetes: Secondary | ICD-10-CM

## 2023-06-02 ENCOUNTER — Ambulatory Visit (INDEPENDENT_AMBULATORY_CARE_PROVIDER_SITE_OTHER): Admitting: Internal Medicine

## 2023-06-02 ENCOUNTER — Encounter: Payer: Self-pay | Admitting: Internal Medicine

## 2023-06-02 VITALS — BP 110/82 | HR 85 | Ht 61.0 in | Wt 184.8 lb

## 2023-06-02 DIAGNOSIS — R7303 Prediabetes: Secondary | ICD-10-CM | POA: Diagnosis not present

## 2023-06-02 DIAGNOSIS — E782 Mixed hyperlipidemia: Secondary | ICD-10-CM | POA: Diagnosis not present

## 2023-06-02 DIAGNOSIS — I10 Essential (primary) hypertension: Secondary | ICD-10-CM

## 2023-06-02 DIAGNOSIS — G43009 Migraine without aura, not intractable, without status migrainosus: Secondary | ICD-10-CM | POA: Diagnosis not present

## 2023-06-02 DIAGNOSIS — F411 Generalized anxiety disorder: Secondary | ICD-10-CM | POA: Diagnosis not present

## 2023-06-02 MED ORDER — TOPIRAMATE 25 MG PO TABS: 25.0000 mg | ORAL_TABLET | Freq: Every day | ORAL | 2 refills | Status: AC

## 2023-06-02 MED ORDER — MOUNJARO 5 MG/0.5ML ~~LOC~~ SOAJ: 5.0000 mg | SUBCUTANEOUS | 0 refills | Status: DC

## 2023-06-02 NOTE — Progress Notes (Signed)
 Established Patient Office Visit  Subjective:  Patient ID: Elizabeth Gay, female    DOB: 06-Dec-1960  Age: 63 y.o. MRN: [id number]  Chief Complaint  Patient presents with   Follow-up    3 week follow up    Patient comes in for her follow-up today.  She is generally feeling well but still gets intermittent headaches.  She has resumed her metoprolol and has been taking Maxalt as needed.  However will add Topamax 25 mg/day for her migraines.  She is tolerating Mounjaro at 2.5 mg/week.  And she has actually lost weight.  She is physically active and is controlling her diet as well.  Will increase the dose to 5 mg/week.  Denies nausea or vomiting, no abdominal pain.    No other concerns at this time.   Past Medical History:  Diagnosis Date   Allergy    Anxiety    Arthritis    Bipolar disorder (HCC)    Depression    Dyspnea    GERD (gastroesophageal reflux disease)    Headache    History of kidney stones    Hypertension    Pre-diabetes    Sickle cell anemia (HCC)     Past Surgical History:  Procedure Laterality Date   ABDOMINAL HYSTERECTOMY     partial   CESAREAN SECTION     x5   COLONOSCOPY WITH PROPOFOL N/A 12/16/2017   Procedure: COLONOSCOPY WITH PROPOFOL;  Surgeon: Wyline Mood, MD;  Location: [facility] ENDOSCOPY;  Service: Gastroenterology;  Laterality: N/A;   KNEE ARTHROSCOPY Left 04/07/2017   Procedure: ARTHROSCOPY KNEE WITH LOOSE BODY REMOVAL;  Surgeon: Kennedy Bucker, MD;  Location: [facility] ORS;  Service: Orthopedics;  Laterality: Left;    Social History   Socioeconomic History   Marital status: Single    Spouse name: Not on file   Number of children: 5   Years of education: Not on file   Highest education level: Not on file  Occupational History   Not on file  Tobacco Use   Smoking status: Never   Smokeless tobacco: Never  Vaping Use   Vaping status: Never Used  Substance and Sexual Activity   Alcohol use: No   Drug use: No   Sexual activity: Not on  file  Other Topics Concern   Not on file  Social History Narrative   Not on file   Social Drivers of Health   Financial Resource Strain: Low Risk  (02/17/2023)   Received from [facility]   Overall Financial Resource Strain (CARDIA)    Difficulty of Paying Living Expenses: Not hard at all  Food Insecurity: No Food Insecurity (02/17/2023)   Received from [facility]   Hunger Vital Sign    Worried About Running Out of Food in the Last Year: Never true    Ran Out of Food in the Last Year: Never true  Transportation Needs: No Transportation Needs (02/17/2023)   Received from [facility] Transportation    In the past 12 months, has lack of transportation kept you from medical appointments or from getting medications?: No    Lack of Transportation (Non-Medical): No  Physical Activity: Not on file  Stress: Not on file  Social Connections: Not on file  Intimate Partner Violence: Not on file    Family History  Problem Relation Age of Onset   Diabetes Mother    Throat cancer Mother    Hypertension Sister    Healthy  Son    Healthy Son    Healthy Son    Healthy Son    Healthy Son     Allergies  Allergen Reactions   Tetanus Toxoid Nausea Only    Patient stated she is NOT allergic to tetanus Patient stated she is NOT allergic to tetanus     Outpatient Medications Prior to Visit  Medication Sig   ALPRAZolam (XANAX) 1 MG tablet Take 1 mg by mouth as needed.   buPROPion (WELLBUTRIN) 100 MG tablet Take 1 tablet by mouth as needed.   Cholecalciferol (VITAMIN D3) 1.25 MG (50000 UT) CAPS Take 1 capsule (1.25 mg total) by mouth once a week.   clonazePAM (KLONOPIN) 0.5 MG tablet Take 1 tablet by mouth as needed.   docusate sodium (COLACE) 100 MG capsule Take 100 mg by mouth 2 (two) times daily as needed for mild constipation.    meclizine (ANTIVERT) 25 MG tablet Take 1 tablet (25 mg total) by mouth 3 (three) times daily as  needed for dizziness or nausea.   QUEtiapine (SEROQUEL) 300 MG tablet Take 1 tablet by mouth as needed.   rizatriptan (MAXALT-MLT) 10 MG disintegrating tablet Take 1 tablet (10 mg total) by mouth as needed for migraine. May repeat in 2 hours if needed   sertraline (ZOLOFT) 100 MG tablet Take 1 tablet by mouth as needed.   [DISCONTINUED] tirzepatide [facility]) 2.5 MG/0.5ML Pen Inject 2.5 mg into the skin once a week.   metoprolol succinate (TOPROL-XL) 25 MG 24 hr tablet Take 1 tablet (25 mg total) by mouth daily. (Patient not taking: Reported on 06/02/2023)   potassium chloride (KLOR-CON M) 10 MEQ tablet Take 2 tablets (20 mEq total) by mouth 2 (two) times daily for 3 days. (Patient not taking: Reported on 06/02/2023)   No facility-administered medications prior to visit.    Review of Systems  Constitutional: Negative.  Negative for chills, fever, malaise/fatigue and weight loss.  HENT: Negative.  Negative for sinus pain and sore throat.   Eyes: Negative.   Respiratory: Negative.  Negative for cough and shortness of breath.   Cardiovascular: Negative.  Negative for chest pain, palpitations and leg swelling.  Gastrointestinal: Negative.  Negative for abdominal pain, constipation, diarrhea, heartburn, nausea and vomiting.  Genitourinary: Negative.  Negative for dysuria and flank pain.  Musculoskeletal: Negative.  Negative for joint pain and myalgias.  Skin: Negative.   Neurological:  Positive for headaches. Negative for dizziness.  Endo/Heme/Allergies: Negative.   Psychiatric/Behavioral: Negative.  Negative for depression and suicidal ideas. The patient is not nervous/anxious.        Objective:   BP 110/82   Pulse 85   Ht 5\' 1"  (1.549 m)   Wt 184 lb 12.8 oz (83.8 kg)   SpO2 98%   BMI 34.92 kg/m   Vitals:   06/02/23 1301  BP: 110/82  Pulse: 85  Height: 5\' 1"  (1.549 m)  Weight: 184 lb 12.8 oz (83.8 kg)  SpO2: 98%  BMI (Calculated): 34.94    Physical Exam Vitals and nursing  note reviewed.  Constitutional:      Appearance: Normal appearance.  HENT:     Head: Normocephalic and atraumatic.     Nose: Nose normal.     Mouth/Throat:     Mouth: Mucous membranes are moist.     Pharynx: Oropharynx is clear.  Eyes:     Conjunctiva/sclera: Conjunctivae normal.     Pupils: Pupils are equal, round, and reactive to light.  Cardiovascular:  Rate and Rhythm: Normal rate and regular rhythm.     Pulses: Normal pulses.     Heart sounds: Normal heart sounds. No murmur heard. Pulmonary:     Effort: Pulmonary effort is normal.     Breath sounds: Normal breath sounds. No wheezing.  Abdominal:     General: Bowel sounds are normal.     Palpations: Abdomen is soft.     Tenderness: There is no abdominal tenderness. There is no right CVA tenderness or left CVA tenderness.  Musculoskeletal:        General: Normal range of motion.     Cervical back: Normal range of motion.     Right lower leg: No edema.     Left lower leg: No edema.  Skin:    General: Skin is warm and dry.  Neurological:     General: No focal deficit present.     Mental Status: She is alert and oriented to person, place, and time.  Psychiatric:        Mood and Affect: Mood normal.        Behavior: Behavior normal.      No results found for any visits on 06/02/23.  Recent Results (from the past 2160 hours)  POCT Urinalysis Dipstick (52778)     Status: None   Collection Time: 05/05/23  1:58 PM  Result Value Ref Range   Color, UA Yellow    Clarity, UA Clear    Glucose, UA Negative Negative   Bilirubin, UA Negative    Ketones, UA Negative    Spec Grav, UA 1.015 1.010 - 1.025   Blood, UA Negative    pH, UA 5.5 5.0 - 8.0   Protein, UA Negative Negative   Urobilinogen, UA 0.2 0.2 or 1.0 E.U./dL   Nitrite, UA Negative    Leukocytes, UA Negative Negative   Appearance Clear    Odor No   CBC with Diff     Status: Abnormal   Collection Time: 05/05/23  2:08 PM  Result Value Ref Range   WBC 9.7  3.4 - 10.8 x10E3/uL   RBC 4.51 3.77 - 5.28 x10E6/uL   Hemoglobin 11.1 11.1 - 15.9 g/dL   Hematocrit 24.2 35.3 - 46.6 %   MCV 78 (L) 79 - 97 fL   MCH 24.6 (L) 26.6 - 33.0 pg   MCHC 31.6 31.5 - 35.7 g/dL   RDW 61.4 (H) 43.1 - 54.0 %   Platelets 326 150 - 450 x10E3/uL   Neutrophils 58 Not Estab. %   Lymphs 31 Not Estab. %   Monocytes 7 Not Estab. %   Eos 3 Not Estab. %   Basos 0 Not Estab. %   Neutrophils Absolute 5.6 1.4 - 7.0 x10E3/uL   Lymphocytes Absolute 3.0 0.7 - 3.1 x10E3/uL   Monocytes Absolute 0.7 0.1 - 0.9 x10E3/uL   EOS (ABSOLUTE) 0.3 0.0 - 0.4 x10E3/uL   Basophils Absolute 0.0 0.0 - 0.2 x10E3/uL   Immature Granulocytes 1 Not Estab. %   Immature Grans (Abs) 0.1 0.0 - 0.1 x10E3/uL  CMP14+EGFR     Status: Abnormal   Collection Time: 05/05/23  2:08 PM  Result Value Ref Range   Glucose 79 70 - 99 mg/dL   BUN 9 8 - 27 mg/dL   Creatinine, Ser 0.86 0.57 - 1.00 mg/dL   eGFR 76 >76 PP/JKD/3.26   BUN/Creatinine Ratio 10 (L) 12 - 28   Sodium 144 134 - 144 mmol/L   Potassium 3.4 (L) 3.5 - 5.2  mmol/L   Chloride 107 (H) 96 - 106 mmol/L   CO2 25 20 - 29 mmol/L   Calcium 9.3 8.7 - 10.3 mg/dL   Total Protein 7.1 6.0 - 8.5 g/dL   Albumin 4.1 3.9 - 4.9 g/dL   Globulin, Total 3.0 1.5 - 4.5 g/dL   Bilirubin Total 0.2 0.0 - 1.2 mg/dL   Alkaline Phosphatase 137 (H) 44 - 121 IU/L   AST 27 0 - 40 IU/L   ALT 19 0 - 32 IU/L  TSH     Status: None   Collection Time: 05/05/23  2:08 PM  Result Value Ref Range   TSH 1.040 0.450 - 4.500 uIU/mL  Hemoglobin A1c     Status: Abnormal   Collection Time: 05/05/23  2:08 PM  Result Value Ref Range   Hgb A1c MFr Bld 6.1 (H) 4.8 - 5.6 %    Comment:          Prediabetes: 5.7 - 6.4          Diabetes: >6.4          Glycemic control for adults with diabetes: <7.0    Est. average glucose Bld gHb Est-mCnc 128 mg/dL  Lipid Profile     Status: None   Collection Time: 05/05/23  2:08 PM  Result Value Ref Range   Cholesterol, Total 166 100 - 199 mg/dL    Triglycerides 161 0 - 149 mg/dL   HDL 55 >09 mg/dL   VLDL Cholesterol Cal 21 5 - 40 mg/dL   LDL Chol Calc (NIH) 90 0 - 99 mg/dL   Chol/HDL Ratio 3.0 0.0 - 4.4 ratio    Comment:                                   T. Chol/HDL Ratio                                             Men  Women                               1/2 Avg.Risk  3.4    3.3                                   Avg.Risk  5.0    4.4                                2X Avg.Risk  9.6    7.1                                3X Avg.Risk 23.4   11.0   Vitamin D (25 hydroxy)     Status: Abnormal   Collection Time: 05/05/23  2:08 PM  Result Value Ref Range   Vit D, 25-Hydroxy 20.9 (L) 30.0 - 100.0 ng/mL    Comment: Vitamin D deficiency has been defined by the Institute of Medicine and an Endocrine Society practice guideline as a level of serum 25-OH vitamin D less than 20 ng/mL (1,2). The Endocrine Society went on to  further define vitamin D insufficiency as a level between 21 and 29 ng/mL (2). 1. IOM (Institute of Medicine). 2010. Dietary reference    intakes for calcium and D. [place]: The    [employer]. 2. Holick MF, Binkley [place], Bischoff-Ferrari HA, et al.    Evaluation, treatment, and prevention of vitamin D    deficiency: an Endocrine Society clinical practice    guideline. JCEM. 2011 Jul; 96(7):1911-30.   Potassium     Status: None   Collection Time: 05/12/23  1:30 PM  Result Value Ref Range   Potassium 4.1 3.5 - 5.2 mmol/L      Assessment & Plan:  Patient to continue all her other medications.  Increase Mounjaro to 5 mg/week. Add Topamax for migraine control.  Problem List Items Addressed This Visit     Migraine without aura and without status migrainosus, not intractable   Relevant Medications   topiramate (TOPAMAX) 25 MG tablet   Mixed hyperlipidemia   GAD (generalized anxiety disorder)   Essential hypertension, benign - Primary   Prediabetes   Relevant Medications   tirzepatide (MOUNJARO) 5  MG/0.5ML Pen    Return in about 5 weeks (around 07/07/2023).   Total time spent: 30 minutes  Aisha Hove, MD  06/02/2023   This document may have been prepared by [facility] Voice Recognition software and as such may include unintentional dictation errors.

## 2023-07-07 ENCOUNTER — Encounter: Payer: Self-pay | Admitting: Internal Medicine

## 2023-07-07 ENCOUNTER — Ambulatory Visit (INDEPENDENT_AMBULATORY_CARE_PROVIDER_SITE_OTHER): Admitting: Internal Medicine

## 2023-07-07 VITALS — BP 128/82 | HR 113 | Ht 61.0 in | Wt 175.6 lb

## 2023-07-07 DIAGNOSIS — I10 Essential (primary) hypertension: Secondary | ICD-10-CM

## 2023-07-07 DIAGNOSIS — R7303 Prediabetes: Secondary | ICD-10-CM | POA: Diagnosis not present

## 2023-07-07 DIAGNOSIS — G43009 Migraine without aura, not intractable, without status migrainosus: Secondary | ICD-10-CM | POA: Diagnosis not present

## 2023-07-07 DIAGNOSIS — F411 Generalized anxiety disorder: Secondary | ICD-10-CM | POA: Diagnosis not present

## 2023-07-07 DIAGNOSIS — E782 Mixed hyperlipidemia: Secondary | ICD-10-CM | POA: Diagnosis not present

## 2023-07-07 DIAGNOSIS — E66811 Obesity, class 1: Secondary | ICD-10-CM | POA: Diagnosis not present

## 2023-07-07 MED ORDER — MOUNJARO 7.5 MG/0.5ML ~~LOC~~ SOAJ: 7.5000 mg | SUBCUTANEOUS | 5 refills | Status: DC

## 2023-07-07 NOTE — Progress Notes (Signed)
 Established Patient Office Visit  Subjective:  Patient ID: Elizabeth Gay, female    DOB: 01-18-1961  Age: 63 y.o. MRN: [id number]  Chief Complaint  Patient presents with   Follow-up    5 week follow up    Patient comes in for her follow-up today.  She is currently on Mounjaro 5 mg/week and has lost weight.  She denies nausea and vomiting, no abdominal pain.  She does get constipation which she has a history of and he takes OTC stool softeners.  She would like to increase the dose to 7.5 mg/week.  She is taking her other medications except antidepressants.  She says she does not need them anymore and her anxiety is also under good control.  Advised to take her metoprolol  regularly, her pulse is fast today.  Migraine headaches are also under better control.    No other concerns at this time.   Past Medical History:  Diagnosis Date   Allergy    Anxiety    Arthritis    Bipolar disorder (HCC)    Depression    Dyspnea    GERD (gastroesophageal reflux disease)    Headache    History of kidney stones    Hypertension    Pre-diabetes    Sickle cell anemia (HCC)     Past Surgical History:  Procedure Laterality Date   ABDOMINAL HYSTERECTOMY     partial   CESAREAN SECTION     x5   COLONOSCOPY WITH PROPOFOL  N/A 12/16/2017   Procedure: COLONOSCOPY WITH PROPOFOL ;  Surgeon: Luke Salaam, MD;  Location: [facility] ENDOSCOPY;  Service: Gastroenterology;  Laterality: N/A;   KNEE ARTHROSCOPY Left 04/07/2017   Procedure: ARTHROSCOPY KNEE WITH LOOSE BODY REMOVAL;  Surgeon: Molli Angelucci, MD;  Location: [facility] ORS;  Service: Orthopedics;  Laterality: Left;    Social History   Socioeconomic History   Marital status: Single    Spouse name: Not on file   Number of children: 5   Years of education: Not on file   Highest education level: Not on file  Occupational History   Not on file  Tobacco Use   Smoking status: Never   Smokeless tobacco: Never  Vaping Use   Vaping status: Never Used   Substance and Sexual Activity   Alcohol use: No   Drug use: No   Sexual activity: Not on file  Other Topics Concern   Not on file  Social History Narrative   Not on file   Social Drivers of Health   Financial Resource Strain: Low Risk  (02/17/2023)   Received from [facility]   Overall Financial Resource Strain (CARDIA)    Difficulty of Paying Living Expenses: Not hard at all  Food Insecurity: No Food Insecurity (02/17/2023)   Received from [facility]   Hunger Vital Sign    Worried About Running Out of Food in the Last Year: Never true    Ran Out of Food in the Last Year: Never true  Transportation Needs: No Transportation Needs (02/17/2023)   Received from [facility]    In the past 12 months, has lack of transportation kept you from medical appointments or from getting medications?: No    Lack of Transportation (Non-Medical): No  Physical Activity: Not on file  Stress: Not on file  Social Connections: Not on file  Intimate Partner Violence: Not on file    Family History  Problem Relation Age of Onset  Diabetes Mother    Throat cancer Mother    Hypertension Sister    Healthy Son    Healthy Son    Healthy Son    Healthy Son    Healthy Son     Allergies  Allergen Reactions   Tetanus Toxoid Nausea Only    Patient stated she is NOT allergic to tetanus Patient stated she is NOT allergic to tetanus     Outpatient Medications Prior to Visit  Medication Sig   buPROPion (WELLBUTRIN) 100 MG tablet Take 1 tablet by mouth as needed.   Cholecalciferol (VITAMIN D3) 1.25 MG (50000 UT) CAPS Take 1 capsule (1.25 mg total) by mouth once a week.   docusate sodium (COLACE) 100 MG capsule Take 100 mg by mouth 2 (two) times daily as needed for mild constipation.    meclizine  (ANTIVERT ) 25 MG tablet Take 1 tablet (25 mg total) by mouth 3 (three) times daily as needed for dizziness or nausea.   metoprolol   succinate (TOPROL -XL) 25 MG 24 hr tablet Take 1 tablet (25 mg total) by mouth daily. (Patient not taking: Reported on 06/02/2023)   potassium chloride  (KLOR-CON  M) 10 MEQ tablet Take 2 tablets (20 mEq total) by mouth 2 (two) times daily for 3 days. (Patient not taking: Reported on 06/02/2023)   rizatriptan  (MAXALT -MLT) 10 MG disintegrating tablet Take 1 tablet (10 mg total) by mouth as needed for migraine. May repeat in 2 hours if needed   sertraline (ZOLOFT) 100 MG tablet Take 1 tablet by mouth as needed.   topiramate  (TOPAMAX ) 25 MG tablet Take 1 tablet (25 mg total) by mouth at bedtime.   [DISCONTINUED] ALPRAZolam (XANAX) 1 MG tablet Take 1 mg by mouth as needed.   [DISCONTINUED] clonazePAM (KLONOPIN) 0.5 MG tablet Take 1 tablet by mouth as needed.   [DISCONTINUED] QUEtiapine (SEROQUEL) 300 MG tablet Take 1 tablet by mouth as needed.   [DISCONTINUED] tirzepatide (MOUNJARO) 5 MG/0.5ML Pen Inject 5 mg into the skin once a week.   No facility-administered medications prior to visit.    Review of Systems  Constitutional:  Positive for weight loss. Negative for chills, diaphoresis, fever and malaise/fatigue.  HENT: Negative.  Negative for congestion, nosebleeds and sore throat.   Eyes: Negative.   Respiratory: Negative.  Negative for cough and shortness of breath.   Cardiovascular: Negative.  Negative for chest pain, palpitations and leg swelling.  Gastrointestinal: Negative.  Negative for abdominal pain, constipation, diarrhea, heartburn, nausea and vomiting.  Genitourinary: Negative.  Negative for dysuria and flank pain.  Musculoskeletal: Negative.  Negative for joint pain and myalgias.  Skin: Negative.   Neurological: Negative.  Negative for dizziness, tingling, tremors and headaches.  Endo/Heme/Allergies: Negative.   Psychiatric/Behavioral: Negative.  Negative for depression and suicidal ideas. The patient is not nervous/anxious.        Objective:   BP 128/82   Pulse (!) 113   Ht 5'  1" (1.549 m)   Wt 175 lb 9.6 oz (79.7 kg)   SpO2 97%   BMI 33.18 kg/m   Vitals:   07/07/23 1303  BP: 128/82  Pulse: (!) 113  Height: 5\' 1"  (1.549 m)  Weight: 175 lb 9.6 oz (79.7 kg)  SpO2: 97%  BMI (Calculated): 33.2    Physical Exam Vitals and nursing note reviewed.  Constitutional:      Appearance: Normal appearance.  HENT:     Head: Normocephalic and atraumatic.     Nose: Nose normal.     Mouth/Throat:  Mouth: Mucous membranes are moist.     Pharynx: Oropharynx is clear.  Eyes:     Conjunctiva/sclera: Conjunctivae normal.     Pupils: Pupils are equal, round, and reactive to light.  Cardiovascular:     Rate and Rhythm: Normal rate and regular rhythm.     Pulses: Normal pulses.     Heart sounds: Normal heart sounds. No murmur heard. Pulmonary:     Effort: Pulmonary effort is normal.     Breath sounds: Normal breath sounds. No wheezing.  Abdominal:     General: Bowel sounds are normal.     Palpations: Abdomen is soft.     Tenderness: There is no abdominal tenderness. There is no right CVA tenderness or left CVA tenderness.  Musculoskeletal:        General: Normal range of motion.     Cervical back: Normal range of motion.     Right lower leg: No edema.     Left lower leg: No edema.  Skin:    General: Skin is warm and dry.  Neurological:     General: No focal deficit present.     Mental Status: She is alert and oriented to person, place, and time.  Psychiatric:        Mood and Affect: Mood normal.        Behavior: Behavior normal.      No results found for any visits on 07/07/23.  Recent Results (from the past 2160 hours)  POCT Urinalysis Dipstick (09811)     Status: None   Collection Time: 05/05/23  1:58 PM  Result Value Ref Range   Color, UA Yellow    Clarity, UA Clear    Glucose, UA Negative Negative   Bilirubin, UA Negative    Ketones, UA Negative    Spec Grav, UA 1.015 1.010 - 1.025   Blood, UA Negative    pH, UA 5.5 5.0 - 8.0   Protein,  UA Negative Negative   Urobilinogen, UA 0.2 0.2 or 1.0 E.U./dL   Nitrite, UA Negative    Leukocytes, UA Negative Negative   Appearance Clear    Odor No   CBC with Diff     Status: Abnormal   Collection Time: 05/05/23  2:08 PM  Result Value Ref Range   WBC 9.7 3.4 - 10.8 x10E3/uL   RBC 4.51 3.77 - 5.28 x10E6/uL   Hemoglobin 11.1 11.1 - 15.9 g/dL   Hematocrit 91.4 78.2 - 46.6 %   MCV 78 (L) 79 - 97 fL   MCH 24.6 (L) 26.6 - 33.0 pg   MCHC 31.6 31.5 - 35.7 g/dL   RDW 95.6 (H) 21.3 - 08.6 %   Platelets 326 150 - 450 x10E3/uL   Neutrophils 58 Not Estab. %   Lymphs 31 Not Estab. %   Monocytes 7 Not Estab. %   Eos 3 Not Estab. %   Basos 0 Not Estab. %   Neutrophils Absolute 5.6 1.4 - 7.0 x10E3/uL   Lymphocytes Absolute 3.0 0.7 - 3.1 x10E3/uL   Monocytes Absolute 0.7 0.1 - 0.9 x10E3/uL   EOS (ABSOLUTE) 0.3 0.0 - 0.4 x10E3/uL   Basophils Absolute 0.0 0.0 - 0.2 x10E3/uL   Immature Granulocytes 1 Not Estab. %   Immature Grans (Abs) 0.1 0.0 - 0.1 x10E3/uL  CMP14+EGFR     Status: Abnormal   Collection Time: 05/05/23  2:08 PM  Result Value Ref Range   Glucose 79 70 - 99 mg/dL   BUN 9 8 - 27  mg/dL   Creatinine, Ser 1.61 0.57 - 1.00 mg/dL   eGFR 76 >09 UE/AVW/0.98   BUN/Creatinine Ratio 10 (L) 12 - 28   Sodium 144 134 - 144 mmol/L   Potassium 3.4 (L) 3.5 - 5.2 mmol/L   Chloride 107 (H) 96 - 106 mmol/L   CO2 25 20 - 29 mmol/L   Calcium 9.3 8.7 - 10.3 mg/dL   Total Protein 7.1 6.0 - 8.5 g/dL   Albumin 4.1 3.9 - 4.9 g/dL   Globulin, Total 3.0 1.5 - 4.5 g/dL   Bilirubin Total 0.2 0.0 - 1.2 mg/dL   Alkaline Phosphatase 137 (H) 44 - 121 IU/L   AST 27 0 - 40 IU/L   ALT 19 0 - 32 IU/L  TSH     Status: None   Collection Time: 05/05/23  2:08 PM  Result Value Ref Range   TSH 1.040 0.450 - 4.500 uIU/mL  Hemoglobin A1c     Status: Abnormal   Collection Time: 05/05/23  2:08 PM  Result Value Ref Range   Hgb A1c MFr Bld 6.1 (H) 4.8 - 5.6 %    Comment:          Prediabetes: 5.7 - 6.4           Diabetes: >6.4          Glycemic control for adults with diabetes: <7.0    Est. average glucose Bld gHb Est-mCnc 128 mg/dL  Lipid Profile     Status: None   Collection Time: 05/05/23  2:08 PM  Result Value Ref Range   Cholesterol, Total 166 100 - 199 mg/dL   Triglycerides 119 0 - 149 mg/dL   HDL 55 >14 mg/dL   VLDL Cholesterol Cal 21 5 - 40 mg/dL   LDL Chol Calc (NIH) 90 0 - 99 mg/dL   Chol/HDL Ratio 3.0 0.0 - 4.4 ratio    Comment:                                   T. Chol/HDL Ratio                                             Men  Women                               1/2 Avg.Risk  3.4    3.3                                   Avg.Risk  5.0    4.4                                2X Avg.Risk  9.6    7.1                                3X Avg.Risk 23.4   11.0   Vitamin D  (25 hydroxy)     Status: Abnormal   Collection Time: 05/05/23  2:08 PM  Result Value Ref Range   Vit D, 25-Hydroxy 20.9 (L) 30.0 - 100.0  ng/mL    Comment: Vitamin D  deficiency has been defined by the Institute of Medicine and an Endocrine Society practice guideline as a level of serum 25-OH vitamin D  less than 20 ng/mL (1,2). The Endocrine Society went on to further define vitamin D  insufficiency as a level between 21 and 29 ng/mL (2). 1. IOM (Institute of Medicine). 2010. Dietary reference    intakes for calcium and D. [place]: The    [employer]. 2. Holick MF, Binkley Zalma, Bischoff-Ferrari HA, et al.    Evaluation, treatment, and prevention of vitamin D     deficiency: an Endocrine Society clinical practice    guideline. JCEM. 2011 Jul; 96(7):1911-30.   Potassium     Status: None   Collection Time: 05/12/23  1:30 PM  Result Value Ref Range   Potassium 4.1 3.5 - 5.2 mmol/L      Assessment & Plan:  Continue current medications.  Increase Mounjaro to 7.5 mg/week. Will check labs at next visit. Problem List Items Addressed This Visit     Migraine without aura and without status migrainosus,  not intractable   Mixed hyperlipidemia   GAD (generalized anxiety disorder)   Essential hypertension, benign - Primary   Prediabetes   Relevant Medications   tirzepatide (MOUNJARO) 7.5 MG/0.5ML Pen   Other Relevant Orders   Amb ref to Medical Nutrition Therapy-MNT   Other Visit Diagnoses       Obesity (BMI 30.0-34.9)       Relevant Orders   Amb ref to Medical Nutrition Therapy-MNT       Return in about 6 weeks (around 08/18/2023).   Total time spent: 30 minutes  Aisha Hove, MD  07/07/2023   This document may have been prepared by [facility] Voice Recognition software and as such may include unintentional dictation errors.

## 2023-07-24 ENCOUNTER — Other Ambulatory Visit: Payer: Self-pay | Admitting: Internal Medicine

## 2023-07-24 DIAGNOSIS — R7303 Prediabetes: Secondary | ICD-10-CM

## 2023-08-02 ENCOUNTER — Encounter: Payer: Self-pay | Admitting: Internal Medicine

## 2023-08-02 ENCOUNTER — Ambulatory Visit (INDEPENDENT_AMBULATORY_CARE_PROVIDER_SITE_OTHER): Admitting: Internal Medicine

## 2023-08-02 ENCOUNTER — Ambulatory Visit
Admission: RE | Admit: 2023-08-02 | Discharge: 2023-08-02 | Disposition: A | Discharge status: Home / Self Care | Source: Ambulatory Visit | Attending: Internal Medicine | Admitting: Internal Medicine

## 2023-08-02 VITALS — BP 142/86 | HR 103 | Ht 61.0 in | Wt 176.2 lb

## 2023-08-02 DIAGNOSIS — R7303 Prediabetes: Secondary | ICD-10-CM

## 2023-08-02 DIAGNOSIS — M79604 Pain in right leg: Secondary | ICD-10-CM | POA: Insufficient documentation

## 2023-08-02 DIAGNOSIS — M79661 Pain in right lower leg: Secondary | ICD-10-CM | POA: Diagnosis not present

## 2023-08-02 DIAGNOSIS — I10 Essential (primary) hypertension: Secondary | ICD-10-CM | POA: Diagnosis not present

## 2023-08-02 DIAGNOSIS — E782 Mixed hyperlipidemia: Secondary | ICD-10-CM | POA: Diagnosis not present

## 2023-08-02 DIAGNOSIS — E66811 Obesity, class 1: Secondary | ICD-10-CM | POA: Diagnosis not present

## 2023-08-02 NOTE — Progress Notes (Signed)
 Established Patient Office Visit  Subjective:  Patient ID: Elizabeth Gay, female    DOB: 27-Feb-1960  Age: 63 y.o. MRN: [id number]  Chief Complaint  Patient presents with   Leg Pain    Right leg pain    Patient comes in with 2-day history of right lower leg and calf pain.  She does not recall injuring her leg or foot.  The pain started spontaneously but then eased up later on.  Next day started again.  She does not notice any swelling or bruising, but there is tenderness on deep palpation.  Denies chest pain or shortness of breath, no fevers and no chills.  No history of travel.    No other concerns at this time.   Past Medical History:  Diagnosis Date   Allergy    Anxiety    Arthritis    Bipolar disorder (HCC)    Depression    Dyspnea    GERD (gastroesophageal reflux disease)    Headache    History of kidney stones    Hypertension    Pre-diabetes    Sickle cell anemia (HCC)     Past Surgical History:  Procedure Laterality Date   ABDOMINAL HYSTERECTOMY     partial   CESAREAN SECTION     x5   COLONOSCOPY WITH PROPOFOL  N/A 12/16/2017   Procedure: COLONOSCOPY WITH PROPOFOL ;  Surgeon: Luke Salaam, MD;  Location: [facility] ENDOSCOPY;  Service: Gastroenterology;  Laterality: N/A;   KNEE ARTHROSCOPY Left 04/07/2017   Procedure: ARTHROSCOPY KNEE WITH LOOSE BODY REMOVAL;  Surgeon: Molli Angelucci, MD;  Location: [facility] ORS;  Service: Orthopedics;  Laterality: Left;    Social History   Socioeconomic History   Marital status: Single    Spouse name: Not on file   Number of children: 5   Years of education: Not on file   Highest education level: Not on file  Occupational History   Not on file  Tobacco Use   Smoking status: Never   Smokeless tobacco: Never  Vaping Use   Vaping status: Never Used  Substance and Sexual Activity   Alcohol use: No   Drug use: No   Sexual activity: Not on file  Other Topics Concern   Not on file  Social History Narrative   Not on file    Social Drivers of Health   Financial Resource Strain: Low Risk  (02/17/2023)   Received from [facility]   Overall Financial Resource Strain (CARDIA)    Difficulty of Paying Living Expenses: Not hard at all  Food Insecurity: No Food Insecurity (02/17/2023)   Received from [facility]   Hunger Vital Sign    Within the past 12 months, you worried that your food would run out before you got the money to buy more.: Never true    Within the past 12 months, the food you bought just didn't last and you didn't have money to get more.: Never true  Transportation Needs: No Transportation Needs (02/17/2023)   Received from [facility]    In the past 12 months, has lack of transportation kept you from medical appointments or from getting medications?: No    Lack of Transportation (Non-Medical): No  Physical Activity: Not on file  Stress: Not on file  Social Connections: Not on file  Intimate Partner Violence: Not on file    Family History  Problem Relation Age of Onset   Diabetes Mother  Throat cancer Mother    Hypertension Sister    Healthy Son    Healthy Son    Healthy Son    Healthy Son    Healthy Son     Allergies  Allergen Reactions   Tetanus Toxoid Nausea Only    Patient stated she is NOT allergic to tetanus Patient stated she is NOT allergic to tetanus     Outpatient Medications Prior to Visit  Medication Sig   buPROPion (WELLBUTRIN) 100 MG tablet Take 1 tablet by mouth as needed.   Cholecalciferol (VITAMIN D3) 1.25 MG (50000 UT) CAPS Take 1 capsule (1.25 mg total) by mouth once a week.   docusate sodium (COLACE) 100 MG capsule Take 100 mg by mouth 2 (two) times daily as needed for mild constipation.    meclizine  (ANTIVERT ) 25 MG tablet Take 1 tablet (25 mg total) by mouth 3 (three) times daily as needed for dizziness or nausea.   rizatriptan  (MAXALT -MLT) 10 MG disintegrating tablet Take 1  tablet (10 mg total) by mouth as needed for migraine. May repeat in 2 hours if needed   sertraline (ZOLOFT) 100 MG tablet Take 1 tablet by mouth as needed.   tirzepatide  (MOUNJARO ) 7.5 MG/0.5ML Pen Inject 7.5 mg into the skin once a week.   topiramate  (TOPAMAX ) 25 MG tablet Take 1 tablet (25 mg total) by mouth at bedtime.   metoprolol  succinate (TOPROL -XL) 25 MG 24 hr tablet Take 1 tablet (25 mg total) by mouth daily. (Patient not taking: Reported on 08/02/2023)   potassium chloride  (KLOR-CON  M) 10 MEQ tablet Take 2 tablets (20 mEq total) by mouth 2 (two) times daily for 3 days. (Patient not taking: Reported on 08/02/2023)   No facility-administered medications prior to visit.    Review of Systems  Constitutional: Negative.  Negative for chills, diaphoresis, fever and malaise/fatigue.  HENT: Negative.  Negative for hearing loss and sore throat.   Eyes: Negative.   Respiratory: Negative.  Negative for cough, shortness of breath and stridor.   Cardiovascular: Negative.  Negative for chest pain, palpitations and leg swelling.  Gastrointestinal: Negative.  Negative for abdominal pain, constipation, diarrhea, heartburn, nausea and vomiting.  Genitourinary: Negative.  Negative for dysuria and flank pain.  Musculoskeletal: Negative.  Negative for joint pain and myalgias.  Skin: Negative.   Neurological: Negative.  Negative for dizziness, tingling, tremors and headaches.  Endo/Heme/Allergies: Negative.   Psychiatric/Behavioral: Negative.  Negative for depression and suicidal ideas. The patient is not nervous/anxious.        Objective:   BP (!) 142/86   Pulse (!) 103   Ht 5' 1 (1.549 m)   Wt 176 lb 3.2 oz (79.9 kg)   SpO2 99%   BMI 33.29 kg/m   Vitals:   08/02/23 1147  BP: (!) 142/86  Pulse: (!) 103  Height: 5' 1 (1.549 m)  Weight: 176 lb 3.2 oz (79.9 kg)  SpO2: 99%  BMI (Calculated): 33.31    Physical Exam Vitals and nursing note reviewed.  Constitutional:      Appearance:  Normal appearance.  HENT:     Head: Normocephalic and atraumatic.     Nose: Nose normal.     Mouth/Throat:     Mouth: Mucous membranes are moist.     Pharynx: Oropharynx is clear.   Eyes:     Conjunctiva/sclera: Conjunctivae normal.     Pupils: Pupils are equal, round, and reactive to light.    Cardiovascular:     Rate and Rhythm: Normal rate  and regular rhythm.     Pulses: Normal pulses.     Heart sounds: Normal heart sounds. No murmur heard. Pulmonary:     Effort: Pulmonary effort is normal.     Breath sounds: Normal breath sounds. No wheezing.  Abdominal:     General: Bowel sounds are normal.     Palpations: Abdomen is soft.     Tenderness: There is no abdominal tenderness. There is no right CVA tenderness or left CVA tenderness.   Musculoskeletal:        General: No tenderness (Right calf-). Normal range of motion.     Cervical back: Normal range of motion.     Right lower leg: No edema.     Left lower leg: No edema.   Skin:    General: Skin is warm and dry.   Neurological:     General: No focal deficit present.     Mental Status: She is alert and oriented to person, place, and time.   Psychiatric:        Mood and Affect: Mood normal.        Behavior: Behavior normal.      No results found for any visits on 08/02/23.  Recent Results (from the past 2160 hours)  POCT Urinalysis Dipstick (09811)     Status: None   Collection Time: 05/05/23  1:58 PM  Result Value Ref Range   Color, UA Yellow    Clarity, UA Clear    Glucose, UA Negative Negative   Bilirubin, UA Negative    Ketones, UA Negative    Spec Grav, UA 1.015 1.010 - 1.025   Blood, UA Negative    pH, UA 5.5 5.0 - 8.0   Protein, UA Negative Negative   Urobilinogen, UA 0.2 0.2 or 1.0 E.U./dL   Nitrite, UA Negative    Leukocytes, UA Negative Negative   Appearance Clear    Odor No   CBC with Diff     Status: Abnormal   Collection Time: 05/05/23  2:08 PM  Result Value Ref Range   WBC 9.7 3.4 - 10.8  x10E3/uL   RBC 4.51 3.77 - 5.28 x10E6/uL   Hemoglobin 11.1 11.1 - 15.9 g/dL   Hematocrit 91.4 78.2 - 46.6 %   MCV 78 (L) 79 - 97 fL   MCH 24.6 (L) 26.6 - 33.0 pg   MCHC 31.6 31.5 - 35.7 g/dL   RDW 95.6 (H) 21.3 - 08.6 %   Platelets 326 150 - 450 x10E3/uL   Neutrophils 58 Not Estab. %   Lymphs 31 Not Estab. %   Monocytes 7 Not Estab. %   Eos 3 Not Estab. %   Basos 0 Not Estab. %   Neutrophils Absolute 5.6 1.4 - 7.0 x10E3/uL   Lymphocytes Absolute 3.0 0.7 - 3.1 x10E3/uL   Monocytes Absolute 0.7 0.1 - 0.9 x10E3/uL   EOS (ABSOLUTE) 0.3 0.0 - 0.4 x10E3/uL   Basophils Absolute 0.0 0.0 - 0.2 x10E3/uL   Immature Granulocytes 1 Not Estab. %   Immature Grans (Abs) 0.1 0.0 - 0.1 x10E3/uL  CMP14+EGFR     Status: Abnormal   Collection Time: 05/05/23  2:08 PM  Result Value Ref Range   Glucose 79 70 - 99 mg/dL   BUN 9 8 - 27 mg/dL   Creatinine, Ser 5.78 0.57 - 1.00 mg/dL   eGFR 76 >46 NG/EXB/2.84   BUN/Creatinine Ratio 10 (L) 12 - 28   Sodium 144 134 - 144 mmol/L   Potassium 3.4 (L)  3.5 - 5.2 mmol/L   Chloride 107 (H) 96 - 106 mmol/L   CO2 25 20 - 29 mmol/L   Calcium 9.3 8.7 - 10.3 mg/dL   Total Protein 7.1 6.0 - 8.5 g/dL   Albumin 4.1 3.9 - 4.9 g/dL   Globulin, Total 3.0 1.5 - 4.5 g/dL   Bilirubin Total 0.2 0.0 - 1.2 mg/dL   Alkaline Phosphatase 137 (H) 44 - 121 IU/L   AST 27 0 - 40 IU/L   ALT 19 0 - 32 IU/L  TSH     Status: None   Collection Time: 05/05/23  2:08 PM  Result Value Ref Range   TSH 1.040 0.450 - 4.500 uIU/mL  Hemoglobin A1c     Status: Abnormal   Collection Time: 05/05/23  2:08 PM  Result Value Ref Range   Hgb A1c MFr Bld 6.1 (H) 4.8 - 5.6 %    Comment:          Prediabetes: 5.7 - 6.4          Diabetes: >6.4          Glycemic control for adults with diabetes: <7.0    Est. average glucose Bld gHb Est-mCnc 128 mg/dL  Lipid Profile     Status: None   Collection Time: 05/05/23  2:08 PM  Result Value Ref Range   Cholesterol, Total 166 100 - 199 mg/dL    Triglycerides 191 0 - 149 mg/dL   HDL 55 >47 mg/dL   VLDL Cholesterol Cal 21 5 - 40 mg/dL   LDL Chol Calc (NIH) 90 0 - 99 mg/dL   Chol/HDL Ratio 3.0 0.0 - 4.4 ratio    Comment:                                   T. Chol/HDL Ratio                                             Men  Women                               1/2 Avg.Risk  3.4    3.3                                   Avg.Risk  5.0    4.4                                2X Avg.Risk  9.6    7.1                                3X Avg.Risk 23.4   11.0   Vitamin D  (25 hydroxy)     Status: Abnormal   Collection Time: 05/05/23  2:08 PM  Result Value Ref Range   Vit D, 25-Hydroxy 20.9 (L) 30.0 - 100.0 ng/mL    Comment: Vitamin D  deficiency has been defined by the Institute of Medicine and an Endocrine Society practice guideline as a level of serum 25-OH vitamin D  less than 20 ng/mL (1,2). The Endocrine Society  went on to further define vitamin D  insufficiency as a level between 21 and 29 ng/mL (2). 1. IOM (Institute of Medicine). 2010. Dietary reference    intakes for calcium and D. [place]: The    [employer]. 2. Holick MF, Binkley , Bischoff-Ferrari HA, et al.    Evaluation, treatment, and prevention of vitamin D     deficiency: an Endocrine Society clinical practice    guideline. JCEM. 2011 Jul; 96(7):1911-30.   Potassium     Status: None   Collection Time: 05/12/23  1:30 PM  Result Value Ref Range   Potassium 4.1 3.5 - 5.2 mmol/L      Assessment & Plan:  Schedule a stat venous Doppler of the right lower extremity.  If negative for DVT, will prescribe analgesia. Problem List Items Addressed This Visit     Mixed hyperlipidemia   Essential hypertension, benign   Prediabetes   Other Visit Diagnoses       Acute leg pain, right    -  Primary   Relevant Orders   US  Venous Img Lower Unilateral Right (DVT)     Obesity (BMI 30.0-34.9)           Return in about 1 week (around 08/09/2023).   Total time  spent: 30 minutes  Aisha Hove, MD  08/02/2023   This document may have been prepared by [facility] Voice Recognition software and as such may include unintentional dictation errors.

## 2023-08-05 ENCOUNTER — Ambulatory Visit: Payer: Self-pay

## 2023-08-09 ENCOUNTER — Ambulatory Visit: Admitting: Internal Medicine

## 2023-08-18 ENCOUNTER — Ambulatory Visit: Admitting: Internal Medicine

## 2023-09-08 ENCOUNTER — Encounter: Payer: Self-pay | Admitting: Internal Medicine

## 2023-09-08 ENCOUNTER — Ambulatory Visit (INDEPENDENT_AMBULATORY_CARE_PROVIDER_SITE_OTHER): Admitting: Internal Medicine

## 2023-09-08 VITALS — BP 132/86 | HR 76 | Ht 61.0 in | Wt 179.0 lb

## 2023-09-08 DIAGNOSIS — E782 Mixed hyperlipidemia: Secondary | ICD-10-CM

## 2023-09-08 DIAGNOSIS — R7303 Prediabetes: Secondary | ICD-10-CM

## 2023-09-08 DIAGNOSIS — F411 Generalized anxiety disorder: Secondary | ICD-10-CM | POA: Diagnosis not present

## 2023-09-08 DIAGNOSIS — I10 Essential (primary) hypertension: Secondary | ICD-10-CM | POA: Diagnosis not present

## 2023-09-08 DIAGNOSIS — Z1231 Encounter for screening mammogram for malignant neoplasm of breast: Secondary | ICD-10-CM | POA: Diagnosis not present

## 2023-09-08 DIAGNOSIS — E559 Vitamin D deficiency, unspecified: Secondary | ICD-10-CM

## 2023-09-08 MED ORDER — SERTRALINE HCL 100 MG PO TABS: 100.0000 mg | ORAL_TABLET | ORAL | 6 refills | Status: AC | PRN

## 2023-09-08 NOTE — Progress Notes (Signed)
 Established Patient Office Visit  Subjective:  Patient ID: Elizabeth Gay, female    DOB: 1960/11/22  Age: 63 y.o. MRN: [id number]  Chief Complaint  Patient presents with   Follow-up    Discuss weight loss meds    Patient comes in for follow-up today.  She has gained back some weight as she was not able to use her Mounjaro  for a few weeks.  She has been under a lot of stress due to some family issues.  However will resume now.  She also has a prescription for Zoloft  but was not taking it regularly.  Advised to resume taking it at 50 mg/day for for next 4 days and then to increase to 100 mg/day.  Denies nausea or vomiting, no abdominal pain and no constipation. Needs a mammogram and fasting blood work.    No other concerns at this time.   Past Medical History:  Diagnosis Date   Allergy    Anxiety    Arthritis    Bipolar disorder (HCC)    Depression    Dyspnea    GERD (gastroesophageal reflux disease)    Headache    History of kidney stones    Hypertension    Pre-diabetes    Sickle cell anemia (HCC)     Past Surgical History:  Procedure Laterality Date   ABDOMINAL HYSTERECTOMY     partial   CESAREAN SECTION     x5   COLONOSCOPY WITH PROPOFOL  N/A 12/16/2017   Procedure: COLONOSCOPY WITH PROPOFOL ;  Surgeon: Therisa Bi, MD;  Location: [facility] ENDOSCOPY;  Service: Gastroenterology;  Laterality: N/A;   KNEE ARTHROSCOPY Left 04/07/2017   Procedure: ARTHROSCOPY KNEE WITH LOOSE BODY REMOVAL;  Surgeon: Kathlynn Sharper, MD;  Location: [facility] ORS;  Service: Orthopedics;  Laterality: Left;    Social History   Socioeconomic History   Marital status: Single    Spouse name: Not on file   Number of children: 5   Years of education: Not on file   Highest education level: Not on file  Occupational History   Not on file  Tobacco Use   Smoking status: Never   Smokeless tobacco: Never  Vaping Use   Vaping status: Never Used  Substance and Sexual Activity   Alcohol use: No    Drug use: No   Sexual activity: Not on file  Other Topics Concern   Not on file  Social History Narrative   Not on file   Social Drivers of Health   Financial Resource Strain: Low Risk  (02/17/2023)   Received from [facility]   Overall Financial Resource Strain (CARDIA)    Difficulty of Paying Living Expenses: Not hard at all  Food Insecurity: No Food Insecurity (02/17/2023)   Received from [facility]   Hunger Vital Sign    Within the past 12 months, you worried that your food would run out before you got the money to buy more.: Never true    Within the past 12 months, the food you bought just didn't last and you didn't have money to get more.: Never true  Transportation Needs: No Transportation Needs (02/17/2023)   Received from [facility] - Transportation    In the past 12 months, has lack of transportation kept you from medical appointments or from getting medications?: No    Lack of Transportation (Non-Medical): No  Physical Activity: Not on file  Stress: Not on file  Social Connections: Not on file  Intimate Partner Violence: Not on file    Family History  Problem Relation Age of Onset   Diabetes Mother    Throat cancer Mother    Hypertension Sister    Healthy Son    Healthy Son    Healthy Son    Healthy Son    Healthy Son     Allergies  Allergen Reactions   Tetanus Toxoid Nausea Only    Patient stated she is NOT allergic to tetanus Patient stated she is NOT allergic to tetanus     Outpatient Medications Prior to Visit  Medication Sig   buPROPion (WELLBUTRIN) 100 MG tablet Take 1 tablet by mouth as needed.   Cholecalciferol (VITAMIN D3) 1.25 MG (50000 UT) CAPS Take 1 capsule (1.25 mg total) by mouth once a week.   docusate sodium (COLACE) 100 MG capsule Take 100 mg by mouth 2 (two) times daily as needed for mild constipation.    meclizine  (ANTIVERT ) 25 MG tablet Take 1 tablet (25 mg total) by mouth  3 (three) times daily as needed for dizziness or nausea.   rizatriptan  (MAXALT -MLT) 10 MG disintegrating tablet Take 1 tablet (10 mg total) by mouth as needed for migraine. May repeat in 2 hours if needed   topiramate  (TOPAMAX ) 25 MG tablet Take 1 tablet (25 mg total) by mouth at bedtime.   [DISCONTINUED] sertraline  (ZOLOFT ) 100 MG tablet Take 1 tablet by mouth as needed.   metoprolol  succinate (TOPROL -XL) 25 MG 24 hr tablet Take 1 tablet (25 mg total) by mouth daily. (Patient not taking: Reported on 09/08/2023)   potassium chloride  (KLOR-CON  M) 10 MEQ tablet Take 2 tablets (20 mEq total) by mouth 2 (two) times daily for 3 days. (Patient not taking: Reported on 09/08/2023)   tirzepatide  (MOUNJARO ) 7.5 MG/0.5ML Pen Inject 7.5 mg into the skin once a week. (Patient not taking: Reported on 09/08/2023)   No facility-administered medications prior to visit.    Review of Systems  Constitutional: Negative.  Negative for chills, diaphoresis, fever, malaise/fatigue and weight loss.  HENT: Negative.  Negative for sore throat.   Eyes: Negative.   Respiratory: Negative.  Negative for cough and shortness of breath.   Cardiovascular: Negative.  Negative for chest pain, palpitations and leg swelling.  Gastrointestinal: Negative.  Negative for abdominal pain, constipation, diarrhea, heartburn, nausea and vomiting.  Genitourinary: Negative.  Negative for dysuria and flank pain.  Musculoskeletal: Negative.  Negative for joint pain and myalgias.  Skin: Negative.   Neurological: Negative.  Negative for dizziness, tingling, tremors, sensory change and headaches.  Endo/Heme/Allergies: Negative.   Psychiatric/Behavioral:  Negative for depression and suicidal ideas. The patient is nervous/anxious.        Objective:   BP 132/86   Pulse 76   Ht 5' 1 (1.549 m)   Wt 179 lb (81.2 kg)   SpO2 99%   BMI 33.82 kg/m   Vitals:   09/08/23 1147  BP: 132/86  Pulse: 76  Height: 5' 1 (1.549 m)  Weight: 179 lb  (81.2 kg)  SpO2: 99%  BMI (Calculated): 33.84    Physical Exam Vitals and nursing note reviewed.  Constitutional:      Appearance: Normal appearance.  HENT:     Head: Normocephalic and atraumatic.     Nose: Nose normal.     Mouth/Throat:     Mouth: Mucous membranes are moist.     Pharynx: Oropharynx is clear.  Eyes:     Conjunctiva/sclera: Conjunctivae normal.     Pupils: Pupils  are equal, round, and reactive to light.  Cardiovascular:     Rate and Rhythm: Normal rate and regular rhythm.     Pulses: Normal pulses.     Heart sounds: Normal heart sounds. No murmur heard. Pulmonary:     Effort: Pulmonary effort is normal.     Breath sounds: Normal breath sounds. No wheezing.  Abdominal:     General: Bowel sounds are normal.     Palpations: Abdomen is soft.     Tenderness: There is no abdominal tenderness. There is no right CVA tenderness or left CVA tenderness.  Musculoskeletal:        General: Normal range of motion.     Cervical back: Normal range of motion.     Right lower leg: No edema.     Left lower leg: No edema.  Skin:    General: Skin is warm and dry.  Neurological:     General: No focal deficit present.     Mental Status: She is alert and oriented to person, place, and time.  Psychiatric:        Mood and Affect: Mood normal.        Behavior: Behavior normal.      No results found for any visits on 09/08/23.  No results found for this or any previous visit (from the past 2160 hours).    Assessment & Plan:  Resume Mounjaro  and Zoloft .  Continue rest of medications.  Check blood work today.  Schedule mammogram. Problem List Items Addressed This Visit     Mixed hyperlipidemia   Relevant Orders   Lipid Profile   GAD (generalized anxiety disorder)   Relevant Medications   sertraline  (ZOLOFT ) 100 MG tablet   Essential hypertension, benign - Primary   Relevant Orders   CMP14+EGFR   Prediabetes   Relevant Orders   Hemoglobin A1c   Vitamin D  deficiency    Relevant Orders   Vitamin D  (25 hydroxy)   Other Visit Diagnoses       Breast cancer screening by mammogram       Relevant Orders   MM 3D SCREENING MAMMOGRAM BILATERAL BREAST       Return in about 1 month (around 10/09/2023).   Total time spent: 30 minutes  FERNAND FREDY RAMAN, MD  09/08/2023   This document may have been prepared by [facility] Voice Recognition software and as such may include unintentional dictation errors.

## 2023-09-09 ENCOUNTER — Other Ambulatory Visit

## 2023-09-09 ENCOUNTER — Telehealth: Payer: Self-pay | Admitting: Internal Medicine

## 2023-09-09 ENCOUNTER — Other Ambulatory Visit: Payer: Self-pay | Admitting: Internal Medicine

## 2023-09-09 DIAGNOSIS — E782 Mixed hyperlipidemia: Secondary | ICD-10-CM | POA: Diagnosis not present

## 2023-09-09 DIAGNOSIS — R7303 Prediabetes: Secondary | ICD-10-CM | POA: Diagnosis not present

## 2023-09-09 DIAGNOSIS — I10 Essential (primary) hypertension: Secondary | ICD-10-CM | POA: Diagnosis not present

## 2023-09-09 DIAGNOSIS — E559 Vitamin D deficiency, unspecified: Secondary | ICD-10-CM | POA: Diagnosis not present

## 2023-09-09 DIAGNOSIS — E66811 Obesity, class 1: Secondary | ICD-10-CM

## 2023-09-09 MED ORDER — MOUNJARO 5 MG/0.5ML ~~LOC~~ SOAJ: 5.0000 mg | SUBCUTANEOUS | 0 refills | Status: DC

## 2023-09-09 NOTE — Telephone Encounter (Signed)
 Patient left VM this morning that she took her Mounjaro  shot this morning and she has been vomiting everywhere ever since. Please advise on what to do. Should she stop the shots or resume them next week? Any advise for the vomiting?

## 2023-09-10 ENCOUNTER — Emergency Department
Admission: EM | Admit: 2023-09-10 | Discharge: 2023-09-10 | Disposition: A | Discharge status: Home / Self Care | Attending: Emergency Medicine | Admitting: Emergency Medicine

## 2023-09-10 ENCOUNTER — Other Ambulatory Visit: Payer: Self-pay

## 2023-09-10 DIAGNOSIS — R000 Tachycardia, unspecified: Secondary | ICD-10-CM | POA: Insufficient documentation

## 2023-09-10 DIAGNOSIS — R42 Dizziness and giddiness: Secondary | ICD-10-CM | POA: Diagnosis not present

## 2023-09-10 LAB — CBC WITH DIFFERENTIAL/PLATELET
Abs Immature Granulocytes: 0.05 K/uL (ref 0.00–0.07)
Basophils Absolute: 0 K/uL (ref 0.0–0.1)
Basophils Relative: 0 %
Eosinophils Absolute: 0.2 K/uL (ref 0.0–0.5)
Eosinophils Relative: 2 %
HCT: 33.6 % — ABNORMAL LOW (ref 36.0–46.0)
Hemoglobin: 10.7 g/dL — ABNORMAL LOW (ref 12.0–15.0)
Immature Granulocytes: 0 %
Lymphocytes Relative: 41 %
Lymphs Abs: 4.6 K/uL — ABNORMAL HIGH (ref 0.7–4.0)
MCH: 24.3 pg — ABNORMAL LOW (ref 26.0–34.0)
MCHC: 31.8 g/dL (ref 30.0–36.0)
MCV: 76.2 fL — ABNORMAL LOW (ref 80.0–100.0)
Monocytes Absolute: 0.9 K/uL (ref 0.1–1.0)
Monocytes Relative: 8 %
Neutro Abs: 5.4 K/uL (ref 1.7–7.7)
Neutrophils Relative %: 49 %
Platelets: 316 K/uL (ref 150–400)
RBC: 4.41 MIL/uL (ref 3.87–5.11)
RDW: 17.4 % — ABNORMAL HIGH (ref 11.5–15.5)
Smear Review: NORMAL
WBC: 11.1 K/uL — ABNORMAL HIGH (ref 4.0–10.5)
nRBC: 0 % (ref 0.0–0.2)

## 2023-09-10 LAB — COMPREHENSIVE METABOLIC PANEL WITH GFR
ALT: 16 U/L (ref 0–44)
AST: 24 U/L (ref 15–41)
Albumin: 3.7 g/dL (ref 3.5–5.0)
Alkaline Phosphatase: 113 U/L (ref 38–126)
Anion gap: 10 (ref 5–15)
BUN: 12 mg/dL (ref 8–23)
CO2: 23 mmol/L (ref 22–32)
Calcium: 8.7 mg/dL — ABNORMAL LOW (ref 8.9–10.3)
Chloride: 108 mmol/L (ref 98–111)
Creatinine, Ser: 0.66 mg/dL (ref 0.44–1.00)
GFR, Estimated: >60 mL/min (ref >60)
Glucose, Bld: 138 mg/dL — ABNORMAL HIGH (ref 70–99)
Potassium: 3.4 mmol/L — ABNORMAL LOW (ref 3.5–5.1)
Sodium: 141 mmol/L (ref 135–145)
Total Bilirubin: 0.4 mg/dL (ref 0.0–1.2)
Total Protein: 7.6 g/dL (ref 6.5–8.1)

## 2023-09-10 LAB — CMP14+EGFR
ALT: 11 IU/L (ref 0–32)
AST: 16 IU/L (ref 0–40)
Albumin: 3.9 g/dL (ref 3.9–4.9)
Alkaline Phosphatase: 143 IU/L — ABNORMAL HIGH (ref 44–121)
BUN/Creatinine Ratio: 12 (ref 12–28)
BUN: 9 mg/dL (ref 8–27)
Bilirubin Total: <0.2 mg/dL (ref 0.0–1.2)
CO2: 22 mmol/L (ref 20–29)
Calcium: 9.3 mg/dL (ref 8.7–10.3)
Chloride: 105 mmol/L (ref 96–106)
Creatinine, Ser: 0.75 mg/dL (ref 0.57–1.00)
Globulin, Total: 3.4 g/dL (ref 1.5–4.5)
Glucose: 105 mg/dL — ABNORMAL HIGH (ref 70–99)
Potassium: 4.3 mmol/L (ref 3.5–5.2)
Sodium: 143 mmol/L (ref 134–144)
Total Protein: 7.3 g/dL (ref 6.0–8.5)
eGFR: 89 mL/min/1.73 (ref >59)

## 2023-09-10 LAB — LIPID PANEL
Chol/HDL Ratio: 3.1 ratio (ref 0.0–4.4)
Cholesterol, Total: 171 mg/dL (ref 100–199)
HDL: 55 mg/dL (ref >39)
LDL Chol Calc (NIH): 88 mg/dL (ref 0–99)
Triglycerides: 162 mg/dL — ABNORMAL HIGH (ref 0–149)
VLDL Cholesterol Cal: 28 mg/dL (ref 5–40)

## 2023-09-10 LAB — TROPONIN I (HIGH SENSITIVITY): Troponin I (High Sensitivity): 4 ng/L (ref <18)

## 2023-09-10 LAB — HEMOGLOBIN A1C
Est. average glucose Bld gHb Est-mCnc: 123 mg/dL
Hgb A1c MFr Bld: 5.9 % — ABNORMAL HIGH (ref 4.8–5.6)

## 2023-09-10 LAB — VITAMIN D 25 HYDROXY (VIT D DEFICIENCY, FRACTURES): Vit D, 25-Hydroxy: 27.4 ng/mL — ABNORMAL LOW (ref 30.0–100.0)

## 2023-09-10 MED ORDER — MECLIZINE HCL 25 MG PO TABS: 25.0000 mg | ORAL_TABLET | Freq: Three times a day (TID) | ORAL | 0 refills | Status: AC | PRN

## 2023-09-10 MED ORDER — MECLIZINE HCL 25 MG PO TABS
25.0000 mg | ORAL_TABLET | Freq: Once | ORAL | Status: AC
  Administered 2023-09-10: 25 mg via ORAL
  Filled 2023-09-10: qty 1

## 2023-09-10 NOTE — ED Provider Notes (Signed)
 [facility] Provider Note    Event Date/Time   First MD Initiated Contact with Patient 09/10/23 2025     (approximate)   History   Dizziness   HPI  Elizabeth Gay is a 63 year old female presenting to the emergency department for evaluation of dizziness.  Shortly prior to presentation patient went to bend down and pick something off of the ground and when she stood up she felt a dizziness sensation described as a spinning sensation.  Has had vertigo in the past and this does feel similar.  Did not have any more of her meclizine  which she has taken in the past with improvement.  She denies any lightheadedness or syncope.  Dizziness is worse when she is up and walking around, better at rest.  No numbness, tingling, focal weakness.     Physical Exam   Triage Vital Signs: ED Triage Vitals  Encounter Vitals Group     BP 09/10/23 2004 (!) 147/83     Girls Systolic BP Percentile --      Girls Diastolic BP Percentile --      Boys Systolic BP Percentile --      Boys Diastolic BP Percentile --      Pulse Rate 09/10/23 2004 (!) 110     Resp 09/10/23 2004 20     Temp 09/10/23 2004 97.9 F (36.6 C)     Temp Source 09/10/23 2004 Oral     SpO2 09/10/23 2004 99 %     Weight 09/10/23 2005 179 lb (81.2 kg)     Height 09/10/23 2005 5' 1 (1.549 m)     Head Circumference --      Peak Flow --      Pain Score 09/10/23 2005 3     Pain Loc --      Pain Education --      Exclude from Growth Chart --     Most recent vital signs: Vitals:   09/10/23 2004 09/10/23 2100  BP: (!) 147/83 134/75  Pulse: (!) 110 94  Resp: 20 20  Temp: 97.9 F (36.6 C)   SpO2: 99% 97%     General: Awake, interactive  CV:  Regular rate at the time of my evaluation, good peripheral perfusion.  Resp:  Unlabored respirations.  Abd:  Nondistended Neuro:  Alert and oriented, normal extraocular movements, symmetric facial movement, sensation intact over bilateral upper and lower  extremities with 5 out of 5 strength.  Normal finger-to-nose testing.   ED Results / Procedures / Treatments   Labs (all labs ordered are listed, but only abnormal results are displayed) Labs Reviewed  COMPREHENSIVE METABOLIC PANEL WITH GFR - Abnormal; Notable for the following components:      Result Value   Potassium 3.4 (*)    Glucose, Bld 138 (*)    Calcium 8.7 (*)    All other components within normal limits  CBC WITH DIFFERENTIAL/PLATELET - Abnormal; Notable for the following components:   WBC 11.1 (*)    Hemoglobin 10.7 (*)    HCT 33.6 (*)    MCV 76.2 (*)    MCH 24.3 (*)    RDW 17.4 (*)    Lymphs Abs 4.6 (*)    All other components within normal limits  TROPONIN I (HIGH SENSITIVITY)     EKG EKG independently reviewed and interpreted by myself demonstrates:  EKG demonstrate sinus tachycardia at a rate of 107, PR 152, QRS 94, QTc 459, no acute ST changes  RADIOLOGY Imaging independently reviewed and interpreted by myself demonstrates:   Formal Radiology Read:  No results found.  PROCEDURES:  Critical Care performed: No  Procedures   MEDICATIONS ORDERED IN ED: Medications  meclizine  (ANTIVERT ) tablet 25 mg (25 mg Oral Given 09/10/23 2112)     IMPRESSION / MDM / ASSESSMENT AND PLAN / ED COURSE  I reviewed the triage vital signs and the nursing notes.  Differential diagnosis includes, but is not limited to, vertigo likely peripheral such as BPPV, no focal deficits, dizziness at rest, vision changes suggestive of central vertigo, clinical history not consistent with lightheadedness  Patient's presentation is most consistent with acute presentation with potential threat to life or bodily function.  63 year old female presenting to the emergency department for evaluation of dizziness described as a spinning sensation consistent with vertigo.  History of similar.  Mild tachycardia on presentation, improved with time of my initial evaluation.  Labs sent from  triage with mild leukocytosis and anemia.  CMP without significant derangement.  Negative troponin.  EKG reassuring.  Patient given meclizine  here and reevaluated.  She reports significant improvement in her symptoms.  She is comfortable discharge home.  Will DC with prescription for meclizine .  Strict return precautions provided.  Patient discharged in stable condition.      FINAL CLINICAL IMPRESSION(S) / ED DIAGNOSES   Final diagnoses:  Vertigo     Rx / DC Orders   ED Discharge Orders          Ordered    meclizine  (ANTIVERT ) 25 MG tablet  3 times daily PRN        09/10/23 2238             Note:  This document was prepared using Dragon voice recognition software and may include unintentional dictation errors.   Levander Slate, MD 09/10/23 [phone]

## 2023-09-10 NOTE — Discharge Instructions (Addendum)
 You were seen in the ER today for your dizziness.  We fortunately did not and emergency cause for this.  I suspect you have something called vertigo.  I sent a prescription for a medication to help with this to your pharmacy as well as a nausea medication.  Follow-up with your primary care doctor or ENT for further evaluation.  Return to the ER for new or worsening symptoms including severe headache, new numbness, tingling, focal weakness, or any other new or concerning symptoms.

## 2023-09-10 NOTE — ED Triage Notes (Signed)
 Patient ambulatory to triage with complaints of dizziness. Patient states about 45 minutes ago she bent down to pick something off the ground and stood up she felt dizzy. Attempted to lay down after to feel better but states the room was spinning. Endorses prior hx of vertigo, has been a long time since she's had an episode and did not have any meds at home.

## 2023-09-12 ENCOUNTER — Ambulatory Visit: Payer: Self-pay | Admitting: Internal Medicine

## 2023-09-13 ENCOUNTER — Other Ambulatory Visit: Payer: Self-pay

## 2023-09-13 ENCOUNTER — Other Ambulatory Visit: Payer: Self-pay | Admitting: Cardiology

## 2023-09-13 DIAGNOSIS — E66811 Obesity, class 1: Secondary | ICD-10-CM

## 2023-09-13 DIAGNOSIS — E782 Mixed hyperlipidemia: Secondary | ICD-10-CM

## 2023-09-13 DIAGNOSIS — R7303 Prediabetes: Secondary | ICD-10-CM

## 2023-09-15 NOTE — Progress Notes (Signed)
   09/15/2023  Patient ID: Elizabeth Gay, female   DOB: 1960-03-03, 63 y.o.   MRN: [id number]  Pharmacy Quality Measure Review  This patient is appearing on a report for being at risk of failing the adherence measure for diabetes medications this calendar year.   Medication: Mounjaro  Last fill date: 09/09/23 for 28 day supply  Insurance report was not up to date. No action needed at this time.   Jon VEAR Lindau, PharmD Clinical Pharmacist [phone]

## 2023-09-21 DIAGNOSIS — Z1231 Encounter for screening mammogram for malignant neoplasm of breast: Secondary | ICD-10-CM | POA: Diagnosis not present

## 2023-09-27 ENCOUNTER — Ambulatory Visit
Admission: RE | Admit: 2023-09-27 | Discharge: 2023-09-27 | Disposition: A | Discharge status: Home / Self Care | Attending: Cardiology | Admitting: Cardiology

## 2023-09-27 ENCOUNTER — Ambulatory Visit: Admitting: Cardiology

## 2023-09-27 ENCOUNTER — Encounter: Payer: Self-pay | Admitting: Cardiology

## 2023-09-27 ENCOUNTER — Ambulatory Visit
Admission: RE | Admit: 2023-09-27 | Discharge: 2023-09-27 | Disposition: A | Discharge status: Home / Self Care | Source: Ambulatory Visit | Attending: Cardiology | Admitting: Cardiology

## 2023-09-27 VITALS — BP 140/90 | HR 109 | Ht 61.0 in | Wt 181.4 lb

## 2023-09-27 DIAGNOSIS — R1033 Periumbilical pain: Secondary | ICD-10-CM | POA: Insufficient documentation

## 2023-09-27 DIAGNOSIS — I10 Essential (primary) hypertension: Secondary | ICD-10-CM

## 2023-09-27 DIAGNOSIS — R7303 Prediabetes: Secondary | ICD-10-CM

## 2023-09-27 NOTE — Progress Notes (Signed)
 Established Patient Office Visit  Subjective:  Patient ID: Elizabeth Gay, female    DOB: 06-24-1960  Age: 63 y.o. MRN: [id number]  Chief Complaint  Patient presents with   Acute Visit    Abdominal pain    Patient in office for an acute visit, complaining of abdominal pain. Pain started several months ago. Patient reports feeling full around her umbilical area. Patient states she tried Gas-X, prune juice, and stool softener. States she has a bowel movement with no change in how she feels. Will get an abdominal xray to check for bowel blockage.   Abdominal Pain This is a new problem. The current episode started more than 1 month ago. The onset quality is undetermined. The problem occurs intermittently. The problem has been waxing and waning. The pain is located in the periumbilical region. The pain is mild. The quality of the pain is aching. The abdominal pain does not radiate. Associated symptoms include flatus. Pertinent negatives include no constipation, diarrhea, headaches, myalgias, nausea or vomiting. Nothing aggravates the pain. The pain is relieved by Nothing. She has tried nothing (Gas-x, prune juice, stool softener) for the symptoms. The treatment provided no relief.    No other concerns at this time.   Past Medical History:  Diagnosis Date   Allergy    Anxiety    Arthritis    Bipolar disorder (HCC)    Depression    Dyspnea    GERD (gastroesophageal reflux disease)    Headache    History of kidney stones    Hypertension    Pre-diabetes    Sickle cell anemia (HCC)     Past Surgical History:  Procedure Laterality Date   ABDOMINAL HYSTERECTOMY     partial   CESAREAN SECTION     x5   COLONOSCOPY WITH PROPOFOL  N/A 12/16/2017   Procedure: COLONOSCOPY WITH PROPOFOL ;  Surgeon: Therisa Bi, MD;  Location: [facility] ENDOSCOPY;  Service: Gastroenterology;  Laterality: N/A;   KNEE ARTHROSCOPY Left 04/07/2017   Procedure: ARTHROSCOPY KNEE WITH LOOSE BODY REMOVAL;  Surgeon:  Kathlynn Sharper, MD;  Location: [facility] ORS;  Service: Orthopedics;  Laterality: Left;    Social History   Socioeconomic History   Marital status: Single    Spouse name: Not on file   Number of children: 5   Years of education: Not on file   Highest education level: Not on file  Occupational History   Not on file  Tobacco Use   Smoking status: Never   Smokeless tobacco: Never  Vaping Use   Vaping status: Never Used  Substance and Sexual Activity   Alcohol use: No   Drug use: No   Sexual activity: Not on file  Other Topics Concern   Not on file  Social History Narrative   Not on file   Social Drivers of Health   Financial Resource Strain: Low Risk  (02/17/2023)   Received from [facility]   Overall Financial Resource Strain (CARDIA)    Difficulty of Paying Living Expenses: Not hard at all  Food Insecurity: No Food Insecurity (02/17/2023)   Received from [facility]   Hunger Vital Sign    Within the past 12 months, you worried that your food would run out before you got the money to buy more.: Never true    Within the past 12 months, the food you bought just didn't last and you didn't have money to get more.: Never true  Transportation Needs: No Transportation Needs (02/17/2023)  Received from [facility] - Transportation    In the past 12 months, has lack of transportation kept you from medical appointments or from getting medications?: No    Lack of Transportation (Non-Medical): No  Physical Activity: Not on file  Stress: Not on file  Social Connections: Not on file  Intimate Partner Violence: Not on file    Family History  Problem Relation Age of Onset   Diabetes Mother    Throat cancer Mother    Hypertension Sister    Healthy Son    Healthy Son    Healthy Son    Healthy Son    Healthy Son     Allergies  Allergen Reactions   Tetanus Toxoid Nausea Only    Patient stated she is NOT allergic to  tetanus Patient stated she is NOT allergic to tetanus     Outpatient Medications Prior to Visit  Medication Sig   buPROPion (WELLBUTRIN) 100 MG tablet Take 1 tablet by mouth as needed.   Cholecalciferol (VITAMIN D3) 1.25 MG (50000 UT) CAPS Take 1 capsule (1.25 mg total) by mouth once a week.   docusate sodium (COLACE) 100 MG capsule Take 100 mg by mouth 2 (two) times daily as needed for mild constipation.    meclizine  (ANTIVERT ) 25 MG tablet Take 1 tablet (25 mg total) by mouth 3 (three) times daily as needed for dizziness or nausea.   meclizine  (ANTIVERT ) 25 MG tablet Take 1 tablet (25 mg total) by mouth 3 (three) times daily as needed for dizziness.   metoprolol  succinate (TOPROL -XL) 25 MG 24 hr tablet Take 1 tablet (25 mg total) by mouth daily.   sertraline  (ZOLOFT ) 100 MG tablet Take 1 tablet (100 mg total) by mouth as needed.   tirzepatide  (MOUNJARO ) 5 MG/0.5ML Pen Inject 5 mg into the skin once a week.   tirzepatide  (MOUNJARO ) 7.5 MG/0.5ML Pen Inject 7.5 mg into the skin once a week.   topiramate  (TOPAMAX ) 25 MG tablet Take 1 tablet (25 mg total) by mouth at bedtime.   potassium chloride  (KLOR-CON  M) 10 MEQ tablet Take 2 tablets (20 mEq total) by mouth 2 (two) times daily for 3 days. (Patient not taking: Reported on 09/27/2023)   rizatriptan  (MAXALT -MLT) 10 MG disintegrating tablet Take 1 tablet (10 mg total) by mouth as needed for migraine. May repeat in 2 hours if needed (Patient not taking: Reported on 09/27/2023)   No facility-administered medications prior to visit.    Review of Systems  Constitutional: Negative.   HENT: Negative.    Eyes: Negative.   Respiratory: Negative.  Negative for shortness of breath.   Cardiovascular: Negative.  Negative for chest pain.  Gastrointestinal:  Positive for abdominal pain and flatus. Negative for constipation, diarrhea, nausea and vomiting.  Genitourinary: Negative.   Musculoskeletal:  Negative for joint pain and myalgias.  Skin: Negative.    Neurological: Negative.  Negative for dizziness and headaches.  Endo/Heme/Allergies: Negative.   All other systems reviewed and are negative.      Objective:   BP (!) 140/90   Pulse (!) 109   Ht 5' 1 (1.549 m)   Wt 181 lb 6.4 oz (82.3 kg)   SpO2 98%   BMI 34.28 kg/m   Vitals:   09/27/23 1348  BP: (!) 140/90  Pulse: (!) 109  Height: 5' 1 (1.549 m)  Weight: 181 lb 6.4 oz (82.3 kg)  SpO2: 98%  BMI (Calculated): 34.29    Physical Exam Vitals and  nursing note reviewed.  Constitutional:      Appearance: Normal appearance. She is normal weight.  HENT:     Head: Normocephalic and atraumatic.     Nose: Nose normal.     Mouth/Throat:     Mouth: Mucous membranes are moist.  Eyes:     Extraocular Movements: Extraocular movements intact.     Conjunctiva/sclera: Conjunctivae normal.     Pupils: Pupils are equal, round, and reactive to light.  Cardiovascular:     Rate and Rhythm: Normal rate and regular rhythm.     Pulses: Normal pulses.     Heart sounds: Normal heart sounds.  Pulmonary:     Effort: Pulmonary effort is normal.     Breath sounds: Normal breath sounds.  Abdominal:     General: Abdomen is flat. Bowel sounds are normal.     Palpations: Abdomen is soft.  Musculoskeletal:        General: Normal range of motion.     Cervical back: Normal range of motion.  Skin:    General: Skin is warm and dry.  Neurological:     General: No focal deficit present.     Mental Status: She is alert and oriented to person, place, and time.  Psychiatric:        Mood and Affect: Mood normal.        Behavior: Behavior normal.        Thought Content: Thought content normal.        Judgment: Judgment normal.      No results found for any visits on 09/27/23.  Recent Results (from the past 2160 hours)  CMP14+EGFR     Status: Abnormal   Collection Time: 09/09/23  8:45 AM  Result Value Ref Range   Glucose 105 (H) 70 - 99 mg/dL   BUN 9 8 - 27 mg/dL   Creatinine, Ser 9.24  0.57 - 1.00 mg/dL   eGFR 89 >40 fO/fpw/8.26   BUN/Creatinine Ratio 12 12 - 28   Sodium 143 134 - 144 mmol/L   Potassium 4.3 3.5 - 5.2 mmol/L   Chloride 105 96 - 106 mmol/L   CO2 22 20 - 29 mmol/L   Calcium 9.3 8.7 - 10.3 mg/dL   Total Protein 7.3 6.0 - 8.5 g/dL   Albumin 3.9 3.9 - 4.9 g/dL   Globulin, Total 3.4 1.5 - 4.5 g/dL   Bilirubin Total <9.7 0.0 - 1.2 mg/dL   Alkaline Phosphatase 143 (H) 44 - 121 IU/L   AST 16 0 - 40 IU/L   ALT 11 0 - 32 IU/L  Lipid Profile     Status: Abnormal   Collection Time: 09/09/23  8:45 AM  Result Value Ref Range   Cholesterol, Total 171 100 - 199 mg/dL   Triglycerides 837 (H) 0 - 149 mg/dL   HDL 55 >60 mg/dL   VLDL Cholesterol Cal 28 5 - 40 mg/dL   LDL Chol Calc (NIH) 88 0 - 99 mg/dL   Chol/HDL Ratio 3.1 0.0 - 4.4 ratio    Comment:                                   T. Chol/HDL Ratio  Men  Women                               1/2 Avg.Risk  3.4    3.3                                   Avg.Risk  5.0    4.4                                2X Avg.Risk  9.6    7.1                                3X Avg.Risk 23.4   11.0   Vitamin D  (25 hydroxy)     Status: Abnormal   Collection Time: 09/09/23  8:45 AM  Result Value Ref Range   Vit D, 25-Hydroxy 27.4 (L) 30.0 - 100.0 ng/mL    Comment: Vitamin D  deficiency has been defined by the Institute of Medicine and an Endocrine Society practice guideline as a level of serum 25-OH vitamin D  less than 20 ng/mL (1,2). The Endocrine Society went on to further define vitamin D  insufficiency as a level between 21 and 29 ng/mL (2). 1. IOM (Institute of Medicine). 2010. Dietary reference    intakes for calcium and D. [place]: The    [employer]. 2. Holick MF, Binkley Guymon, Bischoff-Ferrari HA, et al.    Evaluation, treatment, and prevention of vitamin D     deficiency: an Endocrine Society clinical practice    guideline. JCEM. 2011 Jul; 96(7):1911-30.    Hemoglobin A1c     Status: Abnormal   Collection Time: 09/09/23  8:45 AM  Result Value Ref Range   Hgb A1c MFr Bld 5.9 (H) 4.8 - 5.6 %    Comment:          Prediabetes: 5.7 - 6.4          Diabetes: >6.4          Glycemic control for adults with diabetes: <7.0    Est. average glucose Bld gHb Est-mCnc 123 mg/dL  Comprehensive metabolic panel     Status: Abnormal   Collection Time: 09/10/23  8:08 PM  Result Value Ref Range   Sodium 141 135 - 145 mmol/L   Potassium 3.4 (L) 3.5 - 5.1 mmol/L   Chloride 108 98 - 111 mmol/L   CO2 23 22 - 32 mmol/L   Glucose, Bld 138 (H) 70 - 99 mg/dL    Comment: Glucose reference range applies only to samples taken after fasting for at least 8 hours.   BUN 12 8 - 23 mg/dL   Creatinine, Ser 9.33 0.44 - 1.00 mg/dL   Calcium 8.7 (L) 8.9 - 10.3 mg/dL   Total Protein 7.6 6.5 - 8.1 g/dL   Albumin 3.7 3.5 - 5.0 g/dL   AST 24 15 - 41 U/L   ALT 16 0 - 44 U/L   Alkaline Phosphatase 113 38 - 126 U/L   Total Bilirubin 0.4 0.0 - 1.2 mg/dL   GFR, Estimated >39 >39 mL/min    Comment: (NOTE) Calculated using the CKD-EPI Creatinine Equation (2021)    Anion gap 10 5 - 15    Comment: Performed at [facility], [address] [place], [place] [address]  CBC with Differential     Status: Abnormal   Collection Time: 09/10/23  8:08 PM  Result Value Ref Range   WBC 11.1 (H) 4.0 - 10.5 K/uL   RBC 4.41 3.87 - 5.11 MIL/uL   Hemoglobin 10.7 (L) 12.0 - 15.0 g/dL   HCT 66.3 (L) 63.9 - 53.9 %   MCV 76.2 (L) 80.0 - 100.0 fL   MCH 24.3 (L) 26.0 - 34.0 pg   MCHC 31.8 30.0 - 36.0 g/dL   RDW 82.5 (H) 88.4 - 84.4 %   Platelets 316 150 - 400 K/uL   nRBC 0.0 0.0 - 0.2 %   Neutrophils Relative % 49 %   Neutro Abs 5.4 1.7 - 7.7 K/uL   Lymphocytes Relative 41 %   Lymphs Abs 4.6 (H) 0.7 - 4.0 K/uL   Monocytes Relative 8 %   Monocytes Absolute 0.9 0.1 - 1.0 K/uL   Eosinophils Relative 2 %   Eosinophils Absolute 0.2 0.0 - 0.5 K/uL   Basophils Relative 0 %   Basophils  Absolute 0.0 0.0 - 0.1 K/uL   WBC Morphology See Note     Comment: RARE ATYPICAL LYMPHS SEEN   RBC Morphology MORPHOLOGY UNREMARKABLE    Smear Review Normal platelet morphology    Immature Granulocytes 0 %   Abs Immature Granulocytes 0.05 0.00 - 0.07 K/uL    Comment: Performed at [facility], [address] [place], [place] [address]  Troponin I (High Sensitivity)     Status: None   Collection Time: 09/10/23  8:08 PM  Result Value Ref Range   Troponin I (High Sensitivity) 4 <18 ng/L    Comment: (NOTE) Elevated high sensitivity troponin I (hsTnI) values and significant  changes across serial measurements may suggest ACS but many other  chronic and acute conditions are known to elevate hsTnI results.  Refer to the Links section for chest pain algorithms and additional  guidance. Performed at [facility], [address] [place], [place] [address]       Assessment & Plan:  Abdominal xray today  Problem List Items Addressed This Visit       Other   Periumbilical abdominal pain - Primary   Relevant Orders   DG Abd 2 Views    Return if symptoms worsen or fail to improve, for as scheduled with NK.   Total time spent: 25 minutes  Google, NP  09/27/2023   This document may have been prepared by Dragon Voice Recognition software and as such may include unintentional dictation errors.

## 2023-10-08 ENCOUNTER — Other Ambulatory Visit: Payer: Self-pay | Admitting: Internal Medicine

## 2023-10-08 DIAGNOSIS — R7303 Prediabetes: Secondary | ICD-10-CM

## 2023-10-08 DIAGNOSIS — E66811 Obesity, class 1: Secondary | ICD-10-CM

## 2023-10-10 ENCOUNTER — Ambulatory Visit: Payer: Self-pay | Admitting: Cardiology

## 2023-10-10 ENCOUNTER — Ambulatory Visit (INDEPENDENT_AMBULATORY_CARE_PROVIDER_SITE_OTHER): Admitting: Internal Medicine

## 2023-10-10 ENCOUNTER — Encounter: Payer: Self-pay | Admitting: Internal Medicine

## 2023-10-10 VITALS — BP 146/98 | HR 82 | Ht 61.0 in | Wt 178.8 lb

## 2023-10-10 DIAGNOSIS — E782 Mixed hyperlipidemia: Secondary | ICD-10-CM | POA: Diagnosis not present

## 2023-10-10 DIAGNOSIS — R7303 Prediabetes: Secondary | ICD-10-CM | POA: Diagnosis not present

## 2023-10-10 DIAGNOSIS — E66811 Obesity, class 1: Secondary | ICD-10-CM | POA: Diagnosis not present

## 2023-10-10 DIAGNOSIS — Z1231 Encounter for screening mammogram for malignant neoplasm of breast: Secondary | ICD-10-CM

## 2023-10-10 DIAGNOSIS — I10 Essential (primary) hypertension: Secondary | ICD-10-CM

## 2023-10-10 NOTE — Progress Notes (Signed)
 Established Patient Office Visit  Subjective:  Patient ID: Elizabeth Gay, female    DOB: 1960-02-24  Age: 63 y.o. MRN: [id number]  Chief Complaint  Patient presents with   Follow-up    1 month follow up    Pt comes in for follow up of her recent visit for abdominal pain, bloating and constipation. Her recent xray abdomen was unremarkable. Today patient is feeling better. She is supposed to start back on Mounjaro  - will do now. Denies any nausea,vomiting or constipation today.    No other concerns at this time.   Past Medical History:  Diagnosis Date   Allergy    Anxiety    Arthritis    Bipolar disorder (HCC)    Depression    Dyspnea    GERD (gastroesophageal reflux disease)    Headache    History of kidney stones    Hypertension    Pre-diabetes    Sickle cell anemia (HCC)     Past Surgical History:  Procedure Laterality Date   ABDOMINAL HYSTERECTOMY     partial   CESAREAN SECTION     x5   COLONOSCOPY WITH PROPOFOL  N/A 12/16/2017   Procedure: COLONOSCOPY WITH PROPOFOL ;  Surgeon: Therisa Bi, MD;  Location: [facility] ENDOSCOPY;  Service: Gastroenterology;  Laterality: N/A;   KNEE ARTHROSCOPY Left 04/07/2017   Procedure: ARTHROSCOPY KNEE WITH LOOSE BODY REMOVAL;  Surgeon: Kathlynn Sharper, MD;  Location: [facility] ORS;  Service: Orthopedics;  Laterality: Left;    Social History   Socioeconomic History   Marital status: Single    Spouse name: Not on file   Number of children: 5   Years of education: Not on file   Highest education level: Not on file  Occupational History   Not on file  Tobacco Use   Smoking status: Never   Smokeless tobacco: Never  Vaping Use   Vaping status: Never Used  Substance and Sexual Activity   Alcohol use: No   Drug use: No   Sexual activity: Not on file  Other Topics Concern   Not on file  Social History Narrative   Not on file   Social Drivers of Health   Financial Resource Strain: Low Risk  (02/17/2023)   Received from [facility]   Overall Financial Resource Strain (CARDIA)    Difficulty of Paying Living Expenses: Not hard at all  Food Insecurity: No Food Insecurity (02/17/2023)   Received from [facility]   Hunger Vital Sign    Within the past 12 months, you worried that your food would run out before you got the money to buy more.: Never true    Within the past 12 months, the food you bought just didn't last and you didn't have money to get more.: Never true  Transportation Needs: No Transportation Needs (02/17/2023)   Received from [facility] - Transportation    In the past 12 months, has lack of transportation kept you from medical appointments or from getting medications?: No    Lack of Transportation (Non-Medical): No  Physical Activity: Not on file  Stress: Not on file  Social Connections: Not on file  Intimate Partner Violence: Not on file    Family History  Problem Relation Age of Onset   Diabetes Mother    Throat cancer Mother    Hypertension Sister    Healthy Son    Healthy Son    Healthy Son    Healthy Son  Healthy Son     Allergies  Allergen Reactions   Tetanus Toxoid Nausea Only    Patient stated she is NOT allergic to tetanus Patient stated she is NOT allergic to tetanus     Outpatient Medications Prior to Visit  Medication Sig   buPROPion (WELLBUTRIN) 100 MG tablet Take 1 tablet by mouth as needed.   Cholecalciferol (VITAMIN D3) 1.25 MG (50000 UT) CAPS Take 1 capsule (1.25 mg total) by mouth once a week.   docusate sodium (COLACE) 100 MG capsule Take 100 mg by mouth 2 (two) times daily as needed for mild constipation.    meclizine  (ANTIVERT ) 25 MG tablet Take 1 tablet (25 mg total) by mouth 3 (three) times daily as needed for dizziness or nausea.   meclizine  (ANTIVERT ) 25 MG tablet Take 1 tablet (25 mg total) by mouth 3 (three) times daily as needed for dizziness.   metoprolol  succinate (TOPROL -XL) 25 MG 24 hr  tablet Take 1 tablet (25 mg total) by mouth daily.   rizatriptan  (MAXALT -MLT) 10 MG disintegrating tablet Take 1 tablet (10 mg total) by mouth as needed for migraine. May repeat in 2 hours if needed   sertraline  (ZOLOFT ) 100 MG tablet Take 1 tablet (100 mg total) by mouth as needed.   topiramate  (TOPAMAX ) 25 MG tablet Take 1 tablet (25 mg total) by mouth at bedtime.   [DISCONTINUED] tirzepatide  (MOUNJARO ) 5 MG/0.5ML Pen Inject 5 mg into the skin once a week.   potassium chloride  (KLOR-CON  M) 10 MEQ tablet Take 2 tablets (20 mEq total) by mouth 2 (two) times daily for 3 days. (Patient not taking: Reported on 10/10/2023)   [DISCONTINUED] tirzepatide  (MOUNJARO ) 7.5 MG/0.5ML Pen Inject 7.5 mg into the skin once a week. (Patient not taking: Reported on 10/10/2023)   No facility-administered medications prior to visit.    Review of Systems  Constitutional: Negative.  Negative for chills, diaphoresis, fever, malaise/fatigue and weight loss.  HENT: Negative.  Negative for sore throat.   Eyes: Negative.   Respiratory: Negative.  Negative for cough and shortness of breath.   Cardiovascular: Negative.  Negative for chest pain, palpitations and leg swelling.  Gastrointestinal: Negative.  Negative for abdominal pain, constipation, diarrhea, heartburn, nausea and vomiting.  Genitourinary: Negative.  Negative for dysuria and flank pain.  Musculoskeletal: Negative.  Negative for joint pain and myalgias.  Skin: Negative.   Neurological: Negative.  Negative for dizziness, tingling, tremors, sensory change and headaches.  Endo/Heme/Allergies: Negative.   Psychiatric/Behavioral: Negative.  Negative for depression and suicidal ideas. The patient is not nervous/anxious.        Objective:   BP (!) 146/98   Pulse 82   Ht 5' 1 (1.549 m)   Wt 178 lb 12.8 oz (81.1 kg)   SpO2 98%   BMI 33.78 kg/m   Vitals:   10/10/23 1203  BP: (!) 146/98  Pulse: 82  Height: 5' 1 (1.549 m)  Weight: 178 lb 12.8 oz (81.1  kg)  SpO2: 98%  BMI (Calculated): 33.8    Physical Exam Vitals and nursing note reviewed.  Constitutional:      Appearance: Normal appearance.  HENT:     Head: Normocephalic and atraumatic.     Nose: Nose normal.     Mouth/Throat:     Mouth: Mucous membranes are moist.     Pharynx: Oropharynx is clear.  Eyes:     Conjunctiva/sclera: Conjunctivae normal.     Pupils: Pupils are equal, round, and reactive to light.  Cardiovascular:  Rate and Rhythm: Normal rate and regular rhythm.     Pulses: Normal pulses.     Heart sounds: Normal heart sounds. No murmur heard. Pulmonary:     Effort: Pulmonary effort is normal.     Breath sounds: Normal breath sounds. No wheezing.  Abdominal:     General: Bowel sounds are normal.     Palpations: Abdomen is soft.     Tenderness: There is no abdominal tenderness. There is no right CVA tenderness or left CVA tenderness.  Musculoskeletal:        General: Normal range of motion.     Cervical back: Normal range of motion.     Right lower leg: No edema.     Left lower leg: No edema.  Skin:    General: Skin is warm and dry.  Neurological:     General: No focal deficit present.     Mental Status: She is alert and oriented to person, place, and time.  Psychiatric:        Mood and Affect: Mood normal.        Behavior: Behavior normal.      No results found for any visits on 10/10/23.  Recent Results (from the past 2160 hours)  CMP14+EGFR     Status: Abnormal   Collection Time: 09/09/23  8:45 AM  Result Value Ref Range   Glucose 105 (H) 70 - 99 mg/dL   BUN 9 8 - 27 mg/dL   Creatinine, Ser 9.24 0.57 - 1.00 mg/dL   eGFR 89 >40 fO/fpw/8.26   BUN/Creatinine Ratio 12 12 - 28   Sodium 143 134 - 144 mmol/L   Potassium 4.3 3.5 - 5.2 mmol/L   Chloride 105 96 - 106 mmol/L   CO2 22 20 - 29 mmol/L   Calcium 9.3 8.7 - 10.3 mg/dL   Total Protein 7.3 6.0 - 8.5 g/dL   Albumin 3.9 3.9 - 4.9 g/dL   Globulin, Total 3.4 1.5 - 4.5 g/dL   Bilirubin  Total <9.7 0.0 - 1.2 mg/dL   Alkaline Phosphatase 143 (H) 44 - 121 IU/L   AST 16 0 - 40 IU/L   ALT 11 0 - 32 IU/L  Lipid Profile     Status: Abnormal   Collection Time: 09/09/23  8:45 AM  Result Value Ref Range   Cholesterol, Total 171 100 - 199 mg/dL   Triglycerides 837 (H) 0 - 149 mg/dL   HDL 55 >60 mg/dL   VLDL Cholesterol Cal 28 5 - 40 mg/dL   LDL Chol Calc (NIH) 88 0 - 99 mg/dL   Chol/HDL Ratio 3.1 0.0 - 4.4 ratio    Comment:                                   T. Chol/HDL Ratio                                             Men  Women                               1/2 Avg.Risk  3.4    3.3  Avg.Risk  5.0    4.4                                2X Avg.Risk  9.6    7.1                                3X Avg.Risk 23.4   11.0   Vitamin D  (25 hydroxy)     Status: Abnormal   Collection Time: 09/09/23  8:45 AM  Result Value Ref Range   Vit D, 25-Hydroxy 27.4 (L) 30.0 - 100.0 ng/mL    Comment: Vitamin D  deficiency has been defined by the Institute of Medicine and an Endocrine Society practice guideline as a level of serum 25-OH vitamin D  less than 20 ng/mL (1,2). The Endocrine Society went on to further define vitamin D  insufficiency as a level between 21 and 29 ng/mL (2). 1. IOM (Institute of Medicine). 2010. Dietary reference    intakes for calcium and D. [place]: The    [employer]. 2. Holick MF, Binkley [place], Bischoff-Ferrari HA, et al.    Evaluation, treatment, and prevention of vitamin D     deficiency: an Endocrine Society clinical practice    guideline. JCEM. 2011 Jul; 96(7):1911-30.   Hemoglobin A1c     Status: Abnormal   Collection Time: 09/09/23  8:45 AM  Result Value Ref Range   Hgb A1c MFr Bld 5.9 (H) 4.8 - 5.6 %    Comment:          Prediabetes: 5.7 - 6.4          Diabetes: >6.4          Glycemic control for adults with diabetes: <7.0    Est. average glucose Bld gHb Est-mCnc 123 mg/dL  Comprehensive metabolic panel      Status: Abnormal   Collection Time: 09/10/23  8:08 PM  Result Value Ref Range   Sodium 141 135 - 145 mmol/L   Potassium 3.4 (L) 3.5 - 5.1 mmol/L   Chloride 108 98 - 111 mmol/L   CO2 23 22 - 32 mmol/L   Glucose, Bld 138 (H) 70 - 99 mg/dL    Comment: Glucose reference range applies only to samples taken after fasting for at least 8 hours.   BUN 12 8 - 23 mg/dL   Creatinine, Ser 9.33 0.44 - 1.00 mg/dL   Calcium 8.7 (L) 8.9 - 10.3 mg/dL   Total Protein 7.6 6.5 - 8.1 g/dL   Albumin 3.7 3.5 - 5.0 g/dL   AST 24 15 - 41 U/L   ALT 16 0 - 44 U/L   Alkaline Phosphatase 113 38 - 126 U/L   Total Bilirubin 0.4 0.0 - 1.2 mg/dL   GFR, Estimated >39 >39 mL/min    Comment: (NOTE) Calculated using the CKD-EPI Creatinine Equation (2021)    Anion gap 10 5 - 15    Comment: Performed at [facility], [address] [place], [place] [address]  CBC with Differential     Status: Abnormal   Collection Time: 09/10/23  8:08 PM  Result Value Ref Range   WBC 11.1 (H) 4.0 - 10.5 K/uL   RBC 4.41 3.87 - 5.11 MIL/uL   Hemoglobin 10.7 (L) 12.0 - 15.0 g/dL   HCT 66.3 (L) 63.9 - 53.9 %   MCV 76.2 (L) 80.0 - 100.0 fL  MCH 24.3 (L) 26.0 - 34.0 pg   MCHC 31.8 30.0 - 36.0 g/dL   RDW 82.5 (H) 88.4 - 84.4 %   Platelets 316 150 - 400 K/uL   nRBC 0.0 0.0 - 0.2 %   Neutrophils Relative % 49 %   Neutro Abs 5.4 1.7 - 7.7 K/uL   Lymphocytes Relative 41 %   Lymphs Abs 4.6 (H) 0.7 - 4.0 K/uL   Monocytes Relative 8 %   Monocytes Absolute 0.9 0.1 - 1.0 K/uL   Eosinophils Relative 2 %   Eosinophils Absolute 0.2 0.0 - 0.5 K/uL   Basophils Relative 0 %   Basophils Absolute 0.0 0.0 - 0.1 K/uL   WBC Morphology See Note     Comment: RARE ATYPICAL LYMPHS SEEN   RBC Morphology MORPHOLOGY UNREMARKABLE    Smear Review Normal platelet morphology    Immature Granulocytes 0 %   Abs Immature Granulocytes 0.05 0.00 - 0.07 K/uL    Comment: Performed at [facility], [address] [place], [place] [address]   Troponin I (High Sensitivity)     Status: None   Collection Time: 09/10/23  8:08 PM  Result Value Ref Range   Troponin I (High Sensitivity) 4 <18 ng/L    Comment: (NOTE) Elevated high sensitivity troponin I (hsTnI) values and significant  changes across serial measurements may suggest ACS but many other  chronic and acute conditions are known to elevate hsTnI results.  Refer to the Links section for chest pain algorithms and additional  guidance. Performed at [facility], [address], [place], [place] [address]       Assessment & Plan:  Resume medication. Maintain adequate hydration. Problem List Items Addressed This Visit     Mixed hyperlipidemia   Prediabetes - Primary   Other Visit Diagnoses       Obesity (BMI 30.0-34.9)         Breast cancer screening by mammogram           Return in about 1 month (around 11/10/2023).   Total time spent: 25 minutes  FERNAND FREDY RAMAN, MD  10/10/2023   This document may have been prepared by [facility] Voice Recognition software and as such may include unintentional dictation errors.

## 2023-11-09 DIAGNOSIS — I10 Essential (primary) hypertension: Secondary | ICD-10-CM | POA: Diagnosis not present

## 2023-11-09 DIAGNOSIS — E669 Obesity, unspecified: Secondary | ICD-10-CM | POA: Diagnosis not present

## 2023-11-09 DIAGNOSIS — R7309 Other abnormal glucose: Secondary | ICD-10-CM | POA: Diagnosis not present

## 2023-11-09 DIAGNOSIS — Z1389 Encounter for screening for other disorder: Secondary | ICD-10-CM | POA: Diagnosis not present

## 2023-11-09 DIAGNOSIS — R519 Headache, unspecified: Secondary | ICD-10-CM | POA: Diagnosis not present

## 2023-11-09 DIAGNOSIS — Z0131 Encounter for examination of blood pressure with abnormal findings: Secondary | ICD-10-CM | POA: Diagnosis not present

## 2023-11-09 DIAGNOSIS — F3132 Bipolar disorder, current episode depressed, moderate: Secondary | ICD-10-CM | POA: Diagnosis not present

## 2023-11-09 DIAGNOSIS — K5900 Constipation, unspecified: Secondary | ICD-10-CM | POA: Diagnosis not present

## 2023-11-10 ENCOUNTER — Ambulatory Visit: Admitting: Internal Medicine

## 2023-12-20 DIAGNOSIS — H5203 Hypermetropia, bilateral: Secondary | ICD-10-CM | POA: Diagnosis not present

## 2023-12-20 DIAGNOSIS — H2513 Age-related nuclear cataract, bilateral: Secondary | ICD-10-CM | POA: Diagnosis not present

## 2023-12-20 DIAGNOSIS — H52223 Regular astigmatism, bilateral: Secondary | ICD-10-CM | POA: Diagnosis not present

## 2024-01-23 NOTE — Progress Notes (Signed)
 Elizabeth Gay                                          MRN: [id number]   01/23/2024   The [facility] Quality Team Specialist reviewed this patient medical record for the purposes of chart review for care gap closure. The following were reviewed: chart review for care gap closure-controlling blood pressure.    [facility] Quality Team

## 2024-03-07 ENCOUNTER — Emergency Department

## 2024-03-07 ENCOUNTER — Other Ambulatory Visit: Payer: Self-pay

## 2024-03-07 ENCOUNTER — Emergency Department
Admission: EM | Admit: 2024-03-07 | Discharge: 2024-03-07 | Disposition: A | Discharge status: Home / Self Care | Source: Ambulatory Visit | Attending: Emergency Medicine | Admitting: Emergency Medicine

## 2024-03-07 DIAGNOSIS — R0789 Other chest pain: Secondary | ICD-10-CM | POA: Diagnosis present

## 2024-03-07 DIAGNOSIS — R079 Chest pain, unspecified: Secondary | ICD-10-CM

## 2024-03-07 DIAGNOSIS — I10 Essential (primary) hypertension: Secondary | ICD-10-CM | POA: Insufficient documentation

## 2024-03-07 LAB — BASIC METABOLIC PANEL WITH GFR
Anion gap: 12 (ref 5–15)
BUN: 10 mg/dL (ref 8–23)
CO2: 25 mmol/L (ref 22–32)
Calcium: 9.2 mg/dL (ref 8.9–10.3)
Chloride: 106 mmol/L (ref 98–111)
Creatinine, Ser: 0.6 mg/dL (ref 0.44–1.00)
GFR, Estimated: >60 mL/min
Glucose, Bld: 105 mg/dL — ABNORMAL HIGH (ref 70–99)
Potassium: 3.5 mmol/L (ref 3.5–5.1)
Sodium: 143 mmol/L (ref 135–145)

## 2024-03-07 LAB — CBC
HCT: 35.7 % — ABNORMAL LOW (ref 36.0–46.0)
Hemoglobin: 11.4 g/dL — ABNORMAL LOW (ref 12.0–15.0)
MCH: 24.2 pg — ABNORMAL LOW (ref 26.0–34.0)
MCHC: 31.9 g/dL (ref 30.0–36.0)
MCV: 75.6 fL — ABNORMAL LOW (ref 80.0–100.0)
Platelets: 324 K/uL (ref 150–400)
RBC: 4.72 MIL/uL (ref 3.87–5.11)
RDW: 17.6 % — ABNORMAL HIGH (ref 11.5–15.5)
WBC: 7.4 K/uL (ref 4.0–10.5)
nRBC: 0 % (ref 0.0–0.2)

## 2024-03-07 LAB — TROPONIN T, HIGH SENSITIVITY: Troponin T High Sensitivity: <6 ng/L (ref 0–19)

## 2024-03-07 MED ORDER — ACETAMINOPHEN 500 MG PO TABS
1000.0000 mg | ORAL_TABLET | Freq: Once | ORAL | Status: AC
  Administered 2024-03-07: 1000 mg via ORAL
  Filled 2024-03-07: qty 2

## 2024-03-07 MED ORDER — LIDOCAINE 5 % EX PTCH
1.0000 | MEDICATED_PATCH | CUTANEOUS | Status: DC
  Administered 2024-03-07: 1 via TRANSDERMAL
  Filled 2024-03-07: qty 1

## 2024-03-07 NOTE — ED Provider Notes (Signed)
 "  [facility] Provider Note    Event Date/Time   First MD Initiated Contact with Patient 03/07/24 [phone]     (approximate)   History   Chief Complaint Chest Pain   HPI  Elizabeth Gay is a 64 y.o. female with past medical history of hypertension, hyperlipidemia, migraines, and bipolar disorder who presents to the ED complaining of chest pain.  Patient reports that she has had 3 days of waxing and waning pain over her left lateral chest wall.  She describes it as a dull ache and pressure with occasional pins-and-needles quality.  She denies any associated fevers, cough, difficulty breathing, pain or swelling in her legs.  She initially presented to the [facility] walk-in clinic, was referred to the ED due to elevated blood pressure.  Patient reports that she had missed a few days of her blood pressure medication recently, but took it as prescribed today.     Physical Exam   Triage Vital Signs: ED Triage Vitals [03/07/24 0929]  Encounter Vitals Group     BP (!) 176/91     Girls Systolic BP Percentile      Girls Diastolic BP Percentile      Boys Systolic BP Percentile      Boys Diastolic BP Percentile      Pulse Rate 92     Resp 20     Temp 98.1 F (36.7 C)     Temp Source Oral     SpO2 98 %     Weight      Height      Head Circumference      Peak Flow      Pain Score 7     Pain Loc      Pain Education      Exclude from Growth Chart     Most recent vital signs: Vitals:   03/07/24 0929  BP: (!) 176/91  Pulse: 92  Resp: 20  Temp: 98.1 F (36.7 C)  SpO2: 98%    Constitutional: Alert and oriented. Eyes: Conjunctivae are normal. Head: Atraumatic. Nose: No congestion/rhinnorhea. Mouth/Throat: Mucous membranes are moist.  Cardiovascular: Normal rate, regular rhythm. Grossly normal heart sounds.  2+ radial pulses bilaterally. Respiratory: Normal respiratory effort.  No retractions. Lungs CTAB.  Left chest wall tenderness to palpation  noted with no overlying erythema, warmth, or vesicles. Gastrointestinal: Soft and nontender. No distention. Musculoskeletal: No lower extremity tenderness nor edema.  Neurologic:  Normal speech and language. No gross focal neurologic deficits are appreciated.    ED Results / Procedures / Treatments   Labs (all labs ordered are listed, but only abnormal results are displayed) Labs Reviewed  BASIC METABOLIC PANEL WITH GFR - Abnormal; Notable for the following components:      Result Value   Glucose, Bld 105 (*)    All other components within normal limits  CBC - Abnormal; Notable for the following components:   Hemoglobin 11.4 (*)    HCT 35.7 (*)    MCV 75.6 (*)    MCH 24.2 (*)    RDW 17.6 (*)    All other components within normal limits  TROPONIN T, HIGH SENSITIVITY     EKG  ED ECG REPORT I, Carlin Palin, the attending physician, personally viewed and interpreted this ECG.   Date: 03/07/2024  EKG Time: 9:29  Rate: 84  Rhythm: normal sinus rhythm  Axis: Normal  Intervals:none  ST&T Change: None  RADIOLOGY Chest x-ray reviewed and interpreted by me  with no infiltrate, edema, or effusion.  PROCEDURES:  Critical Care performed: No  Procedures   MEDICATIONS ORDERED IN ED: Medications  lidocaine  (LIDODERM ) 5 % 1 patch (1 patch Transdermal Patch Applied 03/07/24 1017)  acetaminophen  (TYLENOL ) tablet 1,000 mg (1,000 mg Oral Given 03/07/24 1017)     IMPRESSION / MDM / ASSESSMENT AND PLAN / ED COURSE  I reviewed the triage vital signs and the nursing notes.                              64 y.o. female with past medical history of hypertension, hyperlipidemia, migraines, and bipolar disorder who presents to the ED complaining of 3 days of left lateral chest pain and elevated BP at walk-in clinic.  Patient's presentation is most consistent with acute presentation with potential threat to life or bodily function.  Differential diagnosis includes, but is not limited  to, ACS, PE, pneumonia, pneumothorax, musculoskeletal pain, GERD, anxiety.  Patient nontoxic-appearing and in no acute distress, vital signs are remarkable for hypertension but otherwise reassuring.  EKG shows no evidence of arrhythmia or ischemia and symptoms seem atypical for ACS, especially given pain reproducible with palpation.  Chest x-ray is unremarkable and labs are pending, suspect musculoskeletal etiology and will treat with Lidoderm  patch and Tylenol .  Troponin within normal limits and pain improved following symptomatic treatment, doubt ACS, PE, or other emergent etiology.  Additional labs without significant anemia, leukocytosis, electrolyte abnormality, or AKI.  Patient appropriate for discharge home with outpatient follow-up, was counseled to return to the ED for new or worsening symptoms.  Patient agrees with plan.      FINAL CLINICAL IMPRESSION(S) / ED DIAGNOSES   Final diagnoses:  Nonspecific chest pain  Uncontrolled hypertension     Rx / DC Orders   ED Discharge Orders     None        Note:  This document was prepared using Dragon voice recognition software and may include unintentional dictation errors.   Willo Dunnings, MD 03/07/24 1054  "

## 2024-03-07 NOTE — ED Notes (Signed)
Verified correct patient and correct discharge papers given. Pt alert and oriented X 4, stable for discharge. RR even and unlabored, color WNL. Discussed discharge instructions and follow-up as directed. Discharge medications discussed, when prescribed. Pt had opportunity to ask questions, and RN available to provide patient and/or family education.   

## 2024-03-07 NOTE — ED Triage Notes (Addendum)
 Pt to ED via POV from home. Pt reports left sided CP x3 days and HTN. Pt reports was out of BP medication for a few days and started back taking it yesterday. Denies HA or dizziness.
